# Patient Record
Sex: Female | Born: 1960 | Race: White | Hispanic: No | Marital: Married | Smoking: Never smoker
Health system: Southern US, Community
[De-identification: ages and names within clinical notes are randomized; demographics above are authoritative.]

## PROBLEM LIST (undated history)

## (undated) DIAGNOSIS — D649 Anemia, unspecified: Secondary | ICD-10-CM

## (undated) DIAGNOSIS — Z9889 Other specified postprocedural states: Secondary | ICD-10-CM

## (undated) DIAGNOSIS — K219 Gastro-esophageal reflux disease without esophagitis: Secondary | ICD-10-CM

## (undated) DIAGNOSIS — R002 Palpitations: Secondary | ICD-10-CM

## (undated) DIAGNOSIS — F419 Anxiety disorder, unspecified: Secondary | ICD-10-CM

## (undated) DIAGNOSIS — R0600 Dyspnea, unspecified: Secondary | ICD-10-CM

## (undated) DIAGNOSIS — I10 Essential (primary) hypertension: Secondary | ICD-10-CM

## (undated) DIAGNOSIS — Z8719 Personal history of other diseases of the digestive system: Secondary | ICD-10-CM

## (undated) DIAGNOSIS — I351 Nonrheumatic aortic (valve) insufficiency: Secondary | ICD-10-CM

## (undated) DIAGNOSIS — K8020 Calculus of gallbladder without cholecystitis without obstruction: Secondary | ICD-10-CM

## (undated) HISTORY — DX: Palpitations: R00.2

## (undated) HISTORY — DX: Dyspnea, unspecified: R06.00

## (undated) HISTORY — DX: Anemia, unspecified: D64.9

## (undated) HISTORY — PX: DILATION AND CURETTAGE OF UTERUS: SHX78

## (undated) HISTORY — DX: Other specified postprocedural states: Z98.890

## (undated) HISTORY — DX: Anxiety disorder, unspecified: F41.9

## (undated) HISTORY — DX: Essential (primary) hypertension: I10

## (undated) HISTORY — DX: Calculus of gallbladder without cholecystitis without obstruction: K80.20

## (undated) HISTORY — DX: Personal history of other diseases of the digestive system: Z87.19

## (undated) HISTORY — PX: HYSTEROSCOPY: SHX211

## (undated) HISTORY — DX: Nonrheumatic aortic (valve) insufficiency: I35.1

## (undated) HISTORY — DX: Gastro-esophageal reflux disease without esophagitis: K21.9

---

## 2006-05-21 ENCOUNTER — Ambulatory Visit: Payer: Self-pay | Admitting: Internal Medicine

## 2006-05-21 LAB — CONVERTED CEMR LAB
Basophils Relative: 0.5 % (ref 0.0–1.0)
CO2: 31 meq/L (ref 19–32)
Calcium: 9 mg/dL (ref 8.4–10.5)
Eosinophils Relative: 3.2 % (ref 0.0–5.0)
Free T4: 0.8 ng/dL (ref 0.6–1.6)
GFR calc Af Amer: 173 mL/min
Glucose, Bld: 89 mg/dL (ref 70–99)
HCT: 29.6 % — ABNORMAL LOW (ref 36.0–46.0)
Hemoglobin: 9.6 g/dL — ABNORMAL LOW (ref 12.0–15.0)
Lymphocytes Relative: 27.3 % (ref 12.0–46.0)
Neutro Abs: 4 10*3/uL (ref 1.4–7.7)
Platelets: 298 10*3/uL (ref 150–400)
Saturation Ratios: 4.6 % — ABNORMAL LOW (ref 20.0–50.0)
WBC: 6.6 10*3/uL (ref 4.5–10.5)

## 2006-06-14 ENCOUNTER — Ambulatory Visit: Payer: Self-pay

## 2006-06-14 ENCOUNTER — Encounter: Payer: Self-pay | Admitting: Cardiology

## 2007-02-22 ENCOUNTER — Ambulatory Visit: Payer: Self-pay | Admitting: Internal Medicine

## 2007-08-02 ENCOUNTER — Ambulatory Visit: Payer: Self-pay | Admitting: Internal Medicine

## 2007-08-02 ENCOUNTER — Observation Stay (HOSPITAL_COMMUNITY): Admission: AD | Admit: 2007-08-02 | Discharge: 2007-08-03 | Payer: Self-pay | Admitting: Internal Medicine

## 2008-02-03 ENCOUNTER — Ambulatory Visit: Payer: Self-pay | Admitting: Internal Medicine

## 2009-01-17 ENCOUNTER — Telehealth: Payer: Self-pay | Admitting: Internal Medicine

## 2009-10-08 DIAGNOSIS — I10 Essential (primary) hypertension: Secondary | ICD-10-CM | POA: Insufficient documentation

## 2009-10-08 DIAGNOSIS — R0602 Shortness of breath: Secondary | ICD-10-CM | POA: Insufficient documentation

## 2009-10-11 ENCOUNTER — Encounter (INDEPENDENT_AMBULATORY_CARE_PROVIDER_SITE_OTHER): Payer: Self-pay | Admitting: *Deleted

## 2010-02-17 ENCOUNTER — Ambulatory Visit: Payer: Self-pay | Admitting: Internal Medicine

## 2010-04-08 NOTE — Letter (Signed)
Summary: Appointment - Missed  [facility], Main Office  [address]   [place], [place] [address]   Phone: [phone]  Fax: [phone]     October 11, 2009 MRN: [id number]   [facility] [address] [place], [place]  [address]   Dear Ms. Surowiec,  Our records indicate you missed your appointment on 10-09-2009  with  Dr. Gala Romney  It is very important that we reach you to reschedule this appointment. We look forward to participating in your health care needs. Please contact us at the number listed above at your earliest convenience to reschedule this appointment.     Sincerely,     Lorne Skeens  [facility] Scheduling Team

## 2010-07-03 ENCOUNTER — Encounter: Payer: Self-pay | Admitting: Internal Medicine

## 2010-07-03 ENCOUNTER — Ambulatory Visit (INDEPENDENT_AMBULATORY_CARE_PROVIDER_SITE_OTHER): Payer: BC Managed Care – PPO | Admitting: Internal Medicine

## 2010-07-03 ENCOUNTER — Telehealth: Payer: Self-pay | Admitting: Internal Medicine

## 2010-07-03 VITALS — BP 128/88 | HR 90 | Resp 18 | Ht 62.0 in | Wt 166.4 lb

## 2010-07-03 DIAGNOSIS — R002 Palpitations: Secondary | ICD-10-CM | POA: Insufficient documentation

## 2010-07-03 MED ORDER — CLONAZEPAM 0.5 MG PO TABS: 0.5000 mg | ORAL_TABLET | Freq: Two times a day (BID) | ORAL | Status: AC | PRN

## 2010-07-03 NOTE — Telephone Encounter (Signed)
RN s/w Pt: re: symptoms: SOB, Nausea, Lightheadedness, HR Racing ~109, Elevated B/P 145/97, Burning in Lt Shoulder (not pain) burn down into the muscle, does not feel well. "Feels like heart surging"  Normal typical day: took son to school and returned home; symptoms began.  Pt reports being @ [place] on yesterday for a Rt Leg Ultrasound ~30 mins into scan Pt b/p increased to 163/103. RN advised Pt will review with Heather/Dr Bensimhon.  RN s/w Carollee Herter @ [facility] [phone]:  Pt was there on Wed, 07/02/10 for a Lower Venous (cramps, restless legs). Scan performed while standing. She c/o nausea and dizziness. 98, 149/97; 84, 140/86. Tech returned to room with a Sprite & Pt was standing behind door on cell phone; was asked to have a seat to drink Sprite & Pt reported feeling bad again; 92, 163/103. Tech did not note if b/p was rechecked after last reading.  RN will forward to Heather/Dr Bensimhon for review.

## 2010-07-03 NOTE — Progress Notes (Signed)
HPI:  Lindsey Conley is a very pleasant 50 year old woman who we have seen in the past for palpitations and dyspnea.  She has had a normal Holter monitor and normal echocardiogram.  She was previously referred for stress test, but cancelled due to financial obligations.  She also has a history of anemia secondary to heavy menstrual periods. We have not seen her since 2009.  She presents today for an unscheduled visit due to recurrent palpitations.  Was at [facility] yesterday for u/s mapping of her lower extremities due to spider veins. After about 45 mins of scanning and leg compression. Felt nauseated/hot and then became lightheaded and developed tachypalpitations and dyspnea. No CP. Took BP 140/97. Recovered after about 30 mins.  This am felt OK originally but then developed nausea and tachypalpitations. Then developed some pressure in her neck. Currently peri-menopausal and under a lot of emotional stress. Took ASA and Xanax. Felt much better.   ROS: All systems negative except as listed in HPI, PMH and Problem List.  Past Medical History  Diagnosis Date  . Anxiety   . Migraine   . Tired     Current Outpatient Prescriptions  Medication Sig Dispense Refill  . ALPRAZolam (XANAX) 0.25 MG tablet Take 0.25 mg by mouth at bedtime as needed.        [name] CALCIUM MAGNESIUM 750 PO Take 1 capsule by mouth 2 (two) times daily.        [name] ibuprofen (ADVIL,MOTRIN) 100 MG chewable tablet Chew 100 mg by mouth every 8 (eight) hours as needed.        . Multiple Vitamin (MULTIVITAMIN) tablet Take 1 tablet by mouth daily.           PHYSICAL EXAM: Filed Vitals:   07/03/10 1656  BP: 128/88  Pulse: 90  Resp: 18   General:  Well appearing. No resp difficulty HEENT: normal Neck: supple. JVP flat. Carotids 2+ bilaterally; no bruits. No lymphadenopathy or thryomegaly appreciated. Cor: PMI normal. Regular rate & rhythm. No rubs, gallops or murmurs. Lungs: clear Abdomen: soft, nontender, nondistended.  No hepatosplenomegaly. No bruits or masses. Good bowel sounds. Extremities: no cyanosis, clubbing, rash, edema Neuro: alert & orientedx3, cranial nerves grossly intact. Moves all 4 extremities w/o difficulty. Good distal pulses. Affect pleasant.    ECG: NSR 90 No ST-T wave abnormalities. Low volts   ASSESSMENT & PLAN:

## 2010-07-03 NOTE — Assessment & Plan Note (Signed)
I suspect the majority of her symptoms are related to panic attacks. However, given associated dyspnea and neck pain will proceed with ETT to exclude underlying CAD. Start klonopin 0.5 bid to see if this helps. If this works may eventually need to consider an SSRI or Wellbutrin to limit benzodiazepene use.

## 2010-07-03 NOTE — Telephone Encounter (Signed)
Per Dr Gala Romney ok to add on to end of day, pt is aware appt sch for 5pm today

## 2010-07-03 NOTE — Telephone Encounter (Signed)
Pt states she not feeling right. Pt states her blood pressure 145/97. Pt states she having a burning feeling in her left arm and sob. Pt wants to know if she can come in today.

## 2010-07-03 NOTE — Patient Instructions (Signed)
Start Klonopin 0.5mg  Twice daily  Your physician has requested that you have an exercise tolerance test. For further information please visit [email or web address] Please also follow instruction sheet, as given. Your physician recommends that you schedule a follow-up appointment in: 4 months

## 2010-07-22 NOTE — Assessment & Plan Note (Signed)
[facility]                            CARDIOLOGY OFFICE NOTE   NAME:Lindsey Conley, Lindsey Conley                       MRN:          [id number]  DATE:02/03/2008                            DOB:          1960-08-30    PRIMARY CARE PHYSICIAN:  Lindsey Kuster, MD at [place], [place].   INTERVAL HISTORY:  Ms. Lindsey Conley is a very pleasant 50 year old woman who  has been following for palpitations and dyspnea.  She has had a normal  Holter monitor and normal echocardiogram.  She was previously referred  for stress test, but cancelled due to financial obligations.  She also  has a history of anemia secondary to heavy menstrual periods.   She returns today for routine followup.  Overall, she is doing fairly  well.  She does say that she gets mildly dyspneic with moderate activity  that seems to be fluctuating with her menstrual cycle.  She has not had  any orthopnea, no PND, no lower extremity edema, no chest pain.   CURRENT MEDICATIONS:  Calcium and coenzyme Q-10.   PHYSICAL EXAMINATION:  GENERAL:  She is well-appearing in no acute  distress, ambulates around the clinic without respiratory difficulty.  VITAL SIGNS:  Blood pressure is 132/82, heart rate is 82, weight is 182.  HEENT:  Normal.  NECK:  Supple.  No obvious JVD.  Carotids are 2+ bilaterally without any  bruits.  There is no lymphadenopathy or thyromegaly.  CARDIAC:  PMI is  nondisplaced.  Regular rate and rhythm with soft systolic ejection  murmur at the right sternal border.  S2 is well preserved.  LUNGS:  Clear.  ABDOMEN:  Obese, nontender, nondistended, no hepatosplenomegaly, no  bruits, no masses.  Good bowel sounds.  EXTREMITIES:  Warm with no  cyanosis, clubbing, or edema.  NEURO:  Alert and oriented x3.  Cranial nerves II through XII are  intact.  Affect is very pleasant.   EKG shows normal sinus rhythm at a rate of 82.  No ST-T wave  abnormalities.   ASSESSMENT AND PLAN:  1. Intermittent dyspnea.   I suspect this may be related to her anemia      and heavy periods.  There also may be a component of      deconditioning.  We did discuss the possibility of at some point      proceeding with a stress test to rule out underlying coronary      artery disease.  2. Hypertension.  Blood pressure is mildly elevated.  I encouraged her      to try and lower this by watching her diet and exercising and      trying to lose a bit of weight.  We will see her back in 6 months      to see how she is doing.     Lindsey Buckles. Bensimhon, MD  Electronically Signed    DRB/MedQ  DD: 02/03/2008  DT: 02/03/2008  Job #: [id number]

## 2010-07-22 NOTE — Assessment & Plan Note (Signed)
[facility]                            CARDIOLOGY OFFICE NOTE   NAME:Conley, Lindsey                       MRN:          [id number]  DATE:02/22/2007                            DOB:          05/20/1960    PRIMARY CARE PHYSICIAN:  Dr. Jonette Eva in [place], [place].   INTERVAL HISTORY:  Ms. Lindsey Conley is a very pleasant 50 year old woman  with no know history of coronary artery disease.  We have previously  seen her for palpitations.  She had a normal Holter monitor and normal  echocardiogram.  She also has a history of migraine headaches as well as  anemia presumed secondary to heavy menstrual periods.  She returns today  with her husband for routine followup.  Overall she is doing fairly  well, however she notes that she continues to have problems with heavy  periods and during that week and sometime after she feels quite weak and  with no energy.  She occasionally has some shortness of breath and chest  pains.  These are more prominent over the past few weeks.  She has not  had any prolonged episodes.  She denies any palpitations, no syncope or  presyncope.   CURRENT MEDICATIONS:  Calcium and coenzyme Q10.   PHYSICAL EXAMINATION:  She is in no acute distress, ambulates around the  clinic without any respiratory difficulty.  Blood pressure is 118/80,  heart rate is 76, weight is 179.  HEENT:  Normal.  NECK:  Supple, there is no JVD, carotids are 2+ bilaterally without  bruits, there is no lymphadenopathy or thyromegaly.  CARDIAC:  Regular rate and rhythm; no murmurs, rubs, or gallops.  LUNGS:  Clear.  ABDOMEN:  Soft, nontender, nondistended, obese, no hepatosplenomegaly,  no bruits, no masses, good bowel sounds.  EXTREMITIES:  Warm with no cyanosis, clubbing or edema, no rash.  NEURO:  Alert and oriented x3, cranial nerves II through XII are intact,  moves all 4 extremities without difficulty.  Affect is very pleasant.   ASSESSMENT/PLAN:  Dyspnea and  weakness.  I suspect this is primarily  related to her heavy menstrual periods and anemia.  I have asked her to  follow up with her gynecologist for further evaluation and perhaps  hormonal treatment, however, she is also having occasional/intermittent  chest pain.  I figure it is reasonable to get a screening treadmill  Myoview to rule out any underlying ischemia.   DISPOSITION:  Will see her back in 6 months for routine followup.  Will  contact her with the results of her stress test.     Bevelyn Buckles. Bensimhon, MD  Electronically Signed   DRB/MedQ  DD: 02/22/2007  DT: 02/23/2007  Job #: [id number]

## 2010-07-22 NOTE — Discharge Summary (Signed)
NAME:  Lindsey Conley, Lindsey Conley              ACCOUNT NO.:  [id number]   MEDICAL RECORD NO.:  [id number]          PATIENT TYPE:  INP   LOCATION:  6531                         FACILITY:  [facility]   PHYSICIAN:  Bevelyn Buckles. Bensimhon, MDDATE OF BIRTH:  Jul 01, 1960   DATE OF ADMISSION:  08/02/2007  DATE OF DISCHARGE:  08/03/2007                               DISCHARGE SUMMARY   PRIMARY CARDIOLOGIST:  Bevelyn Buckles. Bensimhon, MD   PRIMARY CARE PHYSICIAN:  Dr. Jonette Eva, in [place], [place].   PROCEDURE PERFORMED DURING HOSPITALIZATION:  1. Abdominal ultrasound.  A:  Large gallstone noted in the neck of the gallbladder measuring 2.4  cm in diameter with non-distension of the gallbladder or wall  thickening.  No intra or extrahepatic biliary dilation.  Liver and  spleen are normal without lesion.   FINAL DISCHARGE DIAGNOSES:  1. Noncardiac chest pain.  2. Cholelithiasis.  A:  Gall stone measuring 2.4 cm in diameter in the neck of the  gallbladder for ultrasound.  1. Anxiety.  2. History of anemia secondary to dysmenorrhagia.  3. Esophageal spasm.  4. History of migraine headaches.   HOSPITAL COURSE:  This is a 50 year old Caucasian female transferred  from [facility] in [place] secondary to recurrent chest pain  with warm hot flash, feeling dizziness before getting out of bed.  The  morning of admission, she went to the bathroom and then went back to bed  as the symptoms worsened.  Heart was pounding.  She felt a squeezing  inside and could not breath.  The patient presented to [facility] ER after  calling 911, and she requested to be seen by Dr. Arvilla Meres and  was transferred to [facility].  On arrival to [facility], the patient  was seen and examined by myself and Dr. Gala Romney.  The patient is  feeling better now with exception of the headache.  She was mildly  hypertensive at 160/100, which is now normalized.  The patient is  without complaint with the exception of headache on  admission.   The patient did state that in the past she had had some abdominal pain  and she had been told about 4 years ago that she had some gallstones.  Although, she never followed up with it as that was not a recurrence of  symptoms at that time.  We reviewed her past records, and she had a  stress test in 2000, which was found to be normal.  She did follow with  Dr. Gala Romney in 2008, and a stress test was ordered at that time.  The  patient did not arrive for appointment.  The patient was admitted to  rule out myocardial infarction with enzymes to be cycled.   Cardiac enzymes were found to be negative at 0.03, 0.03, and 0.03  respectively.  The patient's cholesterol was found to be normal.  Her  liver enzymes were normal.  Gallbladder ultrasound was completed to  evaluate for gallstones that she states she had some in the past, and it  was found that she did have a large gallstone measuring 2.4 cm in  diameter at  the neck of the gallbladder although this was  nonobstructive.  [facility] was called for evaluation prior to  discharge for need for surgery or outpatient evaluation.  They assessed  the patient, reviewed the gallbladder ultrasound and felt that she could  be followed up with [place] an outpatient.  The patient was discharged home  without any further complaints.  No new medications were ordered.  She  is to follow up with [facility] as an outpatient.   DISCHARGE LABORATORY DATA:  Sodium 141, potassium 3.6, chloride 109, CO2  of 25, BUN 5, creatinine 0.56, and glucose 106.  Hemoglobin 9.2,  hematocrit 28.4, white blood cells 7.8, and platelets 252.  Total  cholesterol 157, triglycerides 80, HDL 45, and LDL 96.  Troponins were  found to be negative x3.  EKG revealing normal sinus rhythm with  ventricular rate of 67 beats per minute.   DISCHARGE MEDICATIONS:  None.   DISCHARGE FOLLOWUP:  1. The patient will follow up with [facility] at [phone] for followup appointment as an outpatient concerning      gallbladder symptoms and biliary colic.  2. The patient is scheduled to see Dr. Arvilla Meres on September 08, 2007, at 2:30 p.m. at which time, a followup stress Celine Ahr will be      discussed at his discretion.  3. The patient is to follow with her primary care physician for      continued evaluation secondary to anemia.  4. The patient is to follow with OB/GYN secondary to dysmenorrhea.   TIME SPENT WITH THE PATIENT TO INCLUDE PHYSICIAN TIME:  35 minutes.      Bettey Mare. Lyman Bishop, NP      Bevelyn Buckles. Bensimhon, MD  Electronically Signed    KML/MEDQ  D:  08/03/2007  T:  08/04/2007  Job:  [id number]   cc:   [facility]  Dr. Jonette Eva

## 2010-07-22 NOTE — H&P (Signed)
NAME:  Lindsey Conley, Lindsey Conley              ACCOUNT NO.:  [id number]   MEDICAL RECORD NO.:  [id number]          PATIENT TYPE:  INP   LOCATION:  6531                         FACILITY:  [facility]   PHYSICIAN:  Bettey Mare. [place], NPDATE OF BIRTH:  12-30-1960   DATE OF ADMISSION:  08/02/2007  DATE OF DISCHARGE:                              HISTORY & PHYSICAL   HISTORY OF PRESENT ILLNESS:  This is a 50 year old Caucasian female with  no prior history of coronary artery disease, who was transferred from  [facility] ER secondary to complaints of chest pain with warm, hot flash,  dizziness before getting out of bed this morning.  She went to the  bathroom and felt worse, very dizzy, lightheaded, went back to bed.  Her  heart was pounding.  She felt queasy inside and could not take deep  breath.  She tried to brush her teeth and was unable to do that  secondary to the symptoms.  She continued to have more dyspnea and went  back to bed.  Her son called 911.  She thought she was dying in front  my family.  She also began to have a burning, heavy feeling in her  chest.  EMS placed her on oxygen and she was found to be mildly  hypertensive in the field at 160/100.  The patient continued to have  flushing feelings on and off while being transported to [facility] ER.  There, the patient was placed on oxygen.  EKG completed revealing sinus  tachycardia only with no evidence of ischemia.  Cardiac enzymes were  found to be negative.  A call was placed to Dr. Arvilla Meres who is  her primary cardiologist and he accepted the patient on transfer.   Since being admitted to [facility], the patient is feeling much  better.  She has complained of a mild headache, otherwise, she has not  had any further complaints of flushing feeling or shortness of breath.   REVIEW OF SYSTEMS:  Positive for dizziness, weakness, heart pounding,  flushing feeling, warmth, hot flash-type symptoms, mild abdominal pain,  low-grade  nausea.   PAST MEDICAL HISTORY:  1. Anemia secondary to dysmenorrhea.  2. Migraine headaches.  3. Anxiety.  4. Esophageal spasms.  5. History of gallstones.   PAST CARDIAC WORKUP:  She had a stress test in 2000, which was normal  per the patient report.  A followup stress was ordered last year, which  the patient did not show up for.  Echocardiogram completed in 2008  revealed an EF of 55-60% with mild aortic valve regurgitation and mild  MVR.   SOCIAL HISTORY:  She lives in [place] with her husband.  She is a  [employer] trader.  She has four children, one of which is autistic.  She does  not smoke, does not drink alcohol, and does not use drugs.   FAMILY HISTORY:  Mother with asthma and irregular heart rate, father  with hypertension, sister with hypertension.   CURRENT MEDICATIONS:  At home, vitamins and Advil p.r.n.   ALLERGIES:  PHENERGAN, PENICILLIN, and PERCOCET.   LABORATORY DATA:  Current  labs are pending.  EKG reveals sinus  tachycardia with ventricular rate of 90 beats per minute.   PHYSICAL EXAMINATION:  VITAL SIGNS:  Blood pressure 98/67, pulse 87,  respirations 23, O2 sat 98%, and temperature 98.5.  HEENT:  Head is normocephalic, atraumatic.  Eyes, PERRLA.  Mucous  membranes pink and moist.  Tongue is midline.  NECK:  Supple with no JVD.  No carotid bruits appreciated.  CARDIOVASCULAR:  Regular rate and rhythm without murmurs, rubs, or  gallops.  LUNGS:  Clear to auscultation without wheezes, rales, or rhonchi.  ABDOMEN:  Soft, mild right upper quadrant tenderness with deep  palpation, although there is a negative Murphy sign.  Bowel sounds 2+.  EXTREMITIES:  Without clubbing, cyanosis, or edema.  Dorsalis pedal  pulses and posterior tibial pulses are 1+ bilaterally.  Radial pulses 1+  bilaterally.  NEURO:  Cranial nerves II through XII are grossly intact.   IMPRESSION:  1. Noncardiac chest pain.  2. Hot flashes.  3. Anxiety.   PLAN:  The patient was seen and  examined by Dr. Arvilla Meres and  myself, flushing, acute dyspnea, and palpitations of unknown etiology,  given normal EKG and cardiac enzymes, doubt this is ischemia, but will  observe for now and cardiac enzymes to rule out an MI.  There is no  evidence of dysrhythmia by EMS.  She was still symptomatic at that time.  Question hot flashes versus panic attack versus reflux, remote chance of  pyelonephritis, although this is very unlikely.  We will observe on  telemetry to rule out MI.  If negative, we will discharge home in the  a.m. for an outpatient Myoview to be rescheduled.  Placed her on a low-  dose beta-blocker with parameters secondary to hypertension.      Bettey Mare. Lyman Bishop, NP     KML/MEDQ  D:  08/02/2007  T:  08/03/2007  Job:  [id number]

## 2010-07-25 NOTE — Assessment & Plan Note (Signed)
[facility]                            CARDIOLOGY OFFICE NOTE   NAME:Conley, Lindsey                       MRN:          [id number]  DATE:05/21/2006                            DOB:          04-Jun-1960    PRIMARY CARE PHYSICIAN:  Dr. Jonette Eva in [place], [place].   REASON FOR CONSULTATION:  Dyspnea and palpitations.   HISTORY OF PRESENT ILLNESS:  Lindsey Conley is a very pleasant 50 year old  woman with no known coronary artery disease. We follow her husband,  Lindsey Conley, in clinic for his HOCM. She tells me that she has a history of  palpitations and saw Dr. Earna Coder in [place] in 2000. She had a 40 hour  monitor and stress test, which were both normal. She notes that over the  past few months, she often feels a little fluttering in her heart,  followed by a very hard beat, which is fairly disconcerting to her. She  also says that she feels very weak and short of breath at times. She  correlates the weakness and shortness of breath with the time  immediately after her menstrual cycle. Later in the month, she tends to  feel better. She says that her palpitations occur, probably 2 or 3 times  a month. She does her best to avoid caffeine. She is under a great deal  of stress. She denies any chest pain. No orthopnea, no PND. She does  have a history of migraine headaches.   REVIEW OF SYSTEMS:  She does have a history of anemia and previously  took iron but stopped due to constipation. She denies snoring. No  witnessed sleep apnea. No fevers, chills, nausea, vomiting, or diarrhea.  The remainder of review of systems is negative except for HPI and  problem list.   PROBLEM LIST:  1. History of palpitations.      a.     Previous workup in 2000 with 48 hour Holter monitor that       were normal per patient report.  2. Migraine headaches.  3. Heavy menstrual periods with associated anemia.  4. History of gallstones.   CURRENT MEDICATIONS:  Calcium and  magnesium as well as coenzyme q.10.   ALLERGIES:  PHENERGAN, PENICILLIN, AMOXICILLIN.   SOCIAL HISTORY:  She lives in [place]. She is married with 5 children.  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 She does not smoke or drink alcohol.   FAMILY HISTORY:  There is no history of premature coronary artery  disease.   PHYSICAL EXAMINATION:  GENERAL:  Well appearing. No acute distress.  Ambulates around the clinic without any respiratory difficulty.  VITAL SIGNS:  Blood pressure 100/79 with a heart rate of 84. Weight is  179.  HEENT:  Sclerae anicteric. EO MI. There are no xanthelasma's. Moist  mucous membranes. Oropharynx clear.  NECK:  Supple. There is no JVD. Carotids are 2+ bilaterally without any  bruits. There is no lymphadenopathy or thyromegaly.  CARDIAC:  Regular rate and rhythm with no murmur, rub, or gallop.  LUNGS:  Clear.  ABDOMEN:  Soft,  nontender, and nondistended. No hepatosplenomegaly. No  bruits, no masses appreciated.  EXTREMITIES:  Warm with no clubbing, cyanosis, or edema. There is good  capillary refill.  NEUROLOGIC:  Alert and oriented times three. Cranial nerves 2-12 are  intact. She moves all 4 extremities without difficulty. Affect is  pleasant.   LABORATORY/DIAGNOSTIC STUDIES:  EKG shows sinus rhythm at a rate of 84  with low voltage. No significant STT wave changes. QT interval is  normal. There is no evidence of preexcitation. PR interval is 124  milliseconds.   ASSESSMENT/PLAN:  1. Palpitations. I suspect that she is having symptomatic PAC's or      PVC's. I told her that these were relatively benign. We did discuss      the option of a beta blocker but given her fatigue and borderline      low blood pressure, we decided to just watch and wait. We will get      an echocardiogram to make sure she has a structurally normal heart.      If they persist, will also put a monitor on her.  2. Fatigue. I suspect this may  be related to anemia with her heavy      menstrual cycles. Will check a CBC and thyroid panel. I have asked      her to followup with her gynecologist.   DISPOSITION:  Will see her back in 2 months for routine followup.     Bevelyn Buckles. Bensimhon, MD  Electronically Signed    DRB/MedQ  DD: 05/21/2006  DT: 05/22/2006  Job #: [id number]   cc:   Dr. Sallyanne Kuster Heist

## 2010-08-27 ENCOUNTER — Encounter: Payer: Self-pay | Admitting: Internal Medicine

## 2010-10-07 ENCOUNTER — Encounter: Payer: Self-pay | Admitting: Internal Medicine

## 2010-10-15 ENCOUNTER — Encounter: Payer: BC Managed Care – PPO | Admitting: Internal Medicine

## 2010-11-05 ENCOUNTER — Encounter: Payer: BC Managed Care – PPO | Admitting: Physician Assistant

## 2010-11-26 ENCOUNTER — Encounter: Payer: BC Managed Care – PPO | Admitting: Physician Assistant

## 2010-12-03 LAB — COMPREHENSIVE METABOLIC PANEL
AST: 24
Albumin: 3.3 — ABNORMAL LOW
Calcium: 8.9
Creatinine, Ser: 0.56
GFR calc Af Amer: >60
Total Protein: 6.2

## 2010-12-03 LAB — CARDIAC PANEL(CRET KIN+CKTOT+MB+TROPI)
CK, MB: 1.2
Relative Index: INVALID
Relative Index: INVALID
Total CK: 52
Troponin I: 0.03

## 2010-12-03 LAB — CBC
MCHC: 32.9
MCV: 66.5 — ABNORMAL LOW
Platelets: 252
RDW: 18.5 — ABNORMAL HIGH
WBC: 7.8

## 2010-12-03 LAB — LIPID PANEL
HDL: 45
Triglycerides: 80
VLDL: 16

## 2010-12-03 LAB — TSH: TSH: 0.648

## 2013-08-25 ENCOUNTER — Encounter (INDEPENDENT_AMBULATORY_CARE_PROVIDER_SITE_OTHER): Payer: Self-pay

## 2013-08-25 ENCOUNTER — Ambulatory Visit (INDEPENDENT_AMBULATORY_CARE_PROVIDER_SITE_OTHER): Payer: BC Managed Care – PPO | Admitting: Cardiology

## 2013-08-25 ENCOUNTER — Encounter: Payer: Self-pay | Admitting: Cardiology

## 2013-08-25 VITALS — BP 125/82 | HR 78 | Ht 62.0 in | Wt 176.8 lb

## 2013-08-25 DIAGNOSIS — R0989 Other specified symptoms and signs involving the circulatory and respiratory systems: Secondary | ICD-10-CM

## 2013-08-25 DIAGNOSIS — R0602 Shortness of breath: Secondary | ICD-10-CM

## 2013-08-25 DIAGNOSIS — R5383 Other fatigue: Secondary | ICD-10-CM | POA: Insufficient documentation

## 2013-08-25 DIAGNOSIS — R0609 Other forms of dyspnea: Secondary | ICD-10-CM

## 2013-08-25 LAB — CBC WITH DIFFERENTIAL/PLATELET
Basophils Absolute: 0 10*3/uL (ref 0.0–0.1)
Basophils Relative: 0.4 % (ref 0.0–3.0)
Eosinophils Absolute: 0.1 10*3/uL (ref 0.0–0.7)
Eosinophils Relative: 1.7 % (ref 0.0–5.0)
HCT: 38.4 % (ref 36.0–46.0)
Hemoglobin: 12.9 g/dL (ref 12.0–15.0)
Lymphocytes Relative: 26.5 % (ref 12.0–46.0)
Lymphs Abs: 2 10*3/uL (ref 0.7–4.0)
MCHC: 33.7 g/dL (ref 30.0–36.0)
MCV: 84.4 fl (ref 78.0–100.0)
Monocytes Absolute: 0.7 10*3/uL (ref 0.1–1.0)
Monocytes Relative: 9.4 % (ref 3.0–12.0)
Neutro Abs: 4.6 10*3/uL (ref 1.4–7.7)
Neutrophils Relative %: 62 % (ref 43.0–77.0)
Platelets: 221 10*3/uL (ref 150.0–400.0)
RBC: 4.55 Mil/uL (ref 3.87–5.11)
RDW: 15.3 % (ref 11.5–15.5)
WBC: 7.4 10*3/uL (ref 4.0–10.5)

## 2013-08-25 LAB — T3, FREE: T3, Free: 2.5 pg/mL (ref 2.3–4.2)

## 2013-08-25 LAB — COMPREHENSIVE METABOLIC PANEL
ALT: 29 U/L (ref 0–35)
AST: 21 U/L (ref 0–37)
Albumin: 3.7 g/dL (ref 3.5–5.2)
Alkaline Phosphatase: 48 U/L (ref 39–117)
BUN: 6 mg/dL (ref 6–23)
CO2: 29 mEq/L (ref 19–32)
Calcium: 9.1 mg/dL (ref 8.4–10.5)
Chloride: 103 mEq/L (ref 96–112)
Creatinine, Ser: 0.5 mg/dL (ref 0.4–1.2)
GFR: 145.04 mL/min (ref >60.00)
Glucose, Bld: 91 mg/dL (ref 70–99)
Potassium: 4.2 mEq/L (ref 3.5–5.1)
Sodium: 138 mEq/L (ref 135–145)
Total Bilirubin: 1 mg/dL (ref 0.2–1.2)
Total Protein: 6.5 g/dL (ref 6.0–8.3)

## 2013-08-25 LAB — TSH: TSH: 0.74 u[IU]/mL (ref 0.35–4.50)

## 2013-08-25 LAB — T4, FREE: Free T4: 0.81 ng/dL (ref 0.60–1.60)

## 2013-08-25 NOTE — Patient Instructions (Addendum)
Your physician recommends that you continue on your current medications as directed. Please refer to the Current Medication list given to you today.  Your physician has requested that you have a stress echocardiogram. For further information please visit [email or web address]. Please follow instruction sheet as given.  Your physician recommends that you return for lab work in: [place] (CMP,TSH,CBC T3 4 FREE)  Your physician recommends that you return for lab work in: [place] E[facility]   Your physician wants you to follow-up in: [place] will receive a reminder letter in the mail two months in advance. If you don't receive a letter, please call our office to schedule the follow-up appointment.  WE WILL CALL YOU WITH YOUR RESULTS

## 2013-08-25 NOTE — Progress Notes (Signed)
Patient ID: Lindsey Conley, female   DOB: [birth year], 53 y.o.   MRN: [id number]     Patient Name: Lindsey Conley Date of Encounter: 08/25/2013  Primary Care Provider:  No primary provider on file. Primary Cardiologist:  Dorothy Spark  Problem List   Past Medical History  Diagnosis Date  . Anxiety   . Migraine   . Tired   . Palpitations     echo and Holter in 2009 were normal  . Gallstones   . Anemia   . Dyspnea   . HTN (hypertension)   . History of colonoscopy    Past Surgical History  Procedure Laterality Date  . Hysteroscopy    . Dilation and curettage of uterus      Allergies  Allergies  Allergen Reactions  . Penicillins   . Phenergan [Promethazine Hcl]     Rash  . Promethazine Hcl    HPI  A very pleasant 53 year old female who just reach her menopause with no significant past medical history who is coming with concerns of exertional shortness of breath and fatigue. The patient was seen by Dr. Britta Mccreedy on in the past as a part of family screening. Patient's husband has history of hypertrophic cardiomyopathy and underwent a myectomy in the past. As a result 5 of her kids were tested but none was proven to have hypertrophic cardiomyopathy dilatated. 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 She is very active however recently she has been feeling exhausted and gets short of breath with for example walking 2 flight of stairs. She denies any chest pain, back pain jaw pain or arm pain. She denies any palpitations. Denies any lower extremity edema.  Home Medications  Prior to Admission medications   Medication Sig Start Date End Date Taking? Authorizing Provider  ALPRAZolam (XANAX) 0.25 MG tablet Take 0.25 mg by mouth at bedtime as needed.     Yes Historical Provider, MD  ibuprofen (ADVIL,MOTRIN) 100 MG chewable tablet Chew 100 mg by mouth every 8 (eight) hours as needed.     Yes Historical  Provider, MD  MAGNESIUM PO Take by mouth as needed.   Yes Historical Provider, MD  Multiple Vitamin (MULTIVITAMIN) tablet Take 1 tablet by mouth daily.     Yes Historical Provider, MD    Family History  Family History  Problem Relation Age of Onset  . Asthma Mother 56  . Arrhythmia Mother   . Hypertension Father 53  . Other Sister 65    platelet disorder    Social History  History   Social History  . Marital Status: Married    Spouse Name: N/A    Number of Children: 65  . Years of Education: N/A   Occupational History  . care giver    Social History Main Topics  . Smoking status: Never Smoker   . Smokeless tobacco: Not on file  . Alcohol Use: No  . Drug Use: No  . Sexual Activity: Not on file   Other Topics Concern  . Not on file   Social History Narrative  . No narrative on file     Review of Systems, as per HPI, otherwise negative General:  No chills, fever, night sweats or weight changes.  Cardiovascular:  No chest pain, dyspnea on exertion, edema, orthopnea, palpitations, paroxysmal nocturnal dyspnea. Dermatological: No rash, lesions/masses Respiratory: No cough, dyspnea Urologic: No hematuria, dysuria Abdominal:   No nausea, vomiting, diarrhea,  bright red blood per rectum, melena, or hematemesis Neurologic:  No visual changes, wkns, changes in mental status. All other systems reviewed and are otherwise negative except as noted above.  Physical Exam  Blood pressure 125/82, pulse 78, height $RemoveBe'5\' 2"'jOETQqUKJ$  (1.575 m), weight 176 lb 12.8 oz (80.196 kg).  General: Pleasant, NAD Psych: Normal affect. Neuro: Alert and oriented X 3. Moves all extremities spontaneously. HEENT: Normal  Neck: Supple without bruits or JVD. Lungs:  Resp regular and unlabored, CTA. Heart: RRR no s3, s4, dear a mild systolic and diastolic murmur.. Abdomen: Soft, non-tender, non-distended, BS + x 4.  Extremities: No clubbing, cyanosis or edema. DP/PT/Radials 2+ and equal  bilaterally.  Labs:  No results found for this basename: CKTOTAL, CKMB, TROPONINI,  in the last 72 hours Lab Results  Component Value Date   WBC 7.8 08/02/2007   HGB 9.3* 08/02/2007   HCT 28.4* 08/02/2007   MCV 66.5* 08/02/2007   PLT 252 08/02/2007    No results found for this basename: DDIMER   No components found with this basename: POCBNP,     Component Value Date/Time   NA 141 08/02/2007 1610   K 3.6 08/02/2007 1610   CL 109 08/02/2007 1610   CO2 25 08/02/2007 1610   GLUCOSE 106* 08/02/2007 1610   BUN 5* 08/02/2007 1610   CREATININE 0.56 08/02/2007 1610   CALCIUM 8.9 08/02/2007 1610   PROT 6.2 08/02/2007 1610   ALBUMIN 3.3* 08/02/2007 1610   AST 24 08/02/2007 1610   ALT 25 08/02/2007 1610   ALKPHOS 36* 08/02/2007 1610   BILITOT 0.8 08/02/2007 1610   GFRNONAA >60 08/02/2007 1610   GFRAA  Value: >60        The eGFR has been calculated using the MDRD equation. This calculation has not been validated in all clinical 08/02/2007 1610   Lab Results  Component Value Date   CHOL  Value: 157        ATP III CLASSIFICATION:  <200     mg/dL   Desirable  200-239  mg/dL   Borderline High  >=240    mg/dL   High 08/02/2007   HDL 45 08/02/2007   LDLCALC  Value: 96        Total Cholesterol/HDL:CHD Risk Coronary Heart Disease Risk Table                     Men   Women  1/2 Average Risk   3.4   3.3 08/02/2007   TRIG 80 08/02/2007    Accessory Clinical Findings  Echocardiogram - 2008 SUMMARY - Overall left ventricular systolic function was normal. Left ventricular ejection fraction was estimated , range being 55 % to 60 %. There were no left ventricular regional wall motion abnormalities. - There was mild aortic valvular regurgitation. - There was mild mitral valvular regurgitation.  ECG - normal sinus rhythm, normal EKG, unchanged from EKG in 2012   Assessment & Plan  A very pleasant 53 year old female  1. profound fatigue and dyspnea on exertion - risk factors include obesity and being  postmenopausal. We'll check labs including comprehensive metabolic profile CBC and TSH we will proceed with stress echocardiogram to rule out any ischemia if all the results are normal we'll encourage patient to start exercising as she may be deconditioned. She had an echocardiogram performed in 2008 with normal biventricular function and mild aortic and mitral insufficiency.  2. blood pressure - well controlled  3. Lipids - not checked in over  file we will draw on the day of her stress test.  If all results normal followup in 1 year.   Dorothy Spark, MD, [facility] 08/25/2013, 11:15 AM

## 2013-10-12 ENCOUNTER — Telehealth: Payer: Self-pay | Admitting: *Deleted

## 2013-10-12 DIAGNOSIS — R0609 Other forms of dyspnea: Principal | ICD-10-CM

## 2013-10-12 DIAGNOSIS — R0600 Dyspnea, unspecified: Secondary | ICD-10-CM

## 2013-10-12 NOTE — Telephone Encounter (Signed)
Contacted pt to inform her that per Dr Meda Coffee her insurance will not cover for her to have a stress echo done.  Informed pt that we will need to set her up to have a GXT and a 2D echo before a stress echo can be performed.  Placed orders into epic.  Pt education provided about GXT instructions.  Pt education provided on what a 2 D echo and GXT is.  Informed pt that someone from our scheduling dept will be contacting her soon to have these 2 appts set up.  Pt states "I prefer having them both on the same day, and on Oct 20, 2013 when the stress echo was originally scheduled." Informed pt that I will certainly inform the schedulers of her preference.  Pt verbalized understanding and agrees with this plan.  Pt very gracious for all the assistance provided.

## 2013-10-12 NOTE — Telephone Encounter (Signed)
Message copied by Nuala Alpha on Thu Oct 12, 2013 10:13 AM ------      Message from: Benancio Deeds      Created: Thu Oct 12, 2013  8:15 AM      Regarding: FW: stress echo       Good morning ladies,            Jeanene Erb can you cancel stress echo and schedule a GXT and 2D echo per Dr. Meda Coffee due to insurance please?            [name] do you mind putting order in for 2D echo and GXT and giving the patient instructions for GXT please?            Thank you ladies so much!  Let me know if I can do anything.            Caryl Pina            ----- Message -----         From: Dorothy Spark, MD         Sent: 10/11/2013   6:22 PM           To: Benancio Deeds      Subject: RE: stress echo                                          That's ok      Thank you,      K      ----- Message -----         From: Benancio Deeds         Sent: 10/10/2013   9:40 AM           To: Dorothy Spark, MD      Subject: stress echo                                              Insurance is denying due to ability to walk on treadmill.            Are you okay with doing a 2D echo and GXT?                  Thanks,            Caryl Pina                   ------

## 2013-10-18 ENCOUNTER — Telehealth (HOSPITAL_COMMUNITY): Payer: Self-pay

## 2013-10-18 NOTE — Telephone Encounter (Signed)
Encounter complete. 

## 2013-10-20 ENCOUNTER — Other Ambulatory Visit (HOSPITAL_COMMUNITY): Payer: BC Managed Care – PPO

## 2013-10-20 ENCOUNTER — Ambulatory Visit (HOSPITAL_BASED_OUTPATIENT_CLINIC_OR_DEPARTMENT_OTHER)
Admission: RE | Admit: 2013-10-20 | Discharge: 2013-10-20 | Disposition: A | Discharge status: Home / Self Care | Payer: BC Managed Care – PPO | Source: Ambulatory Visit | Attending: Cardiology | Admitting: Cardiology

## 2013-10-20 ENCOUNTER — Other Ambulatory Visit: Payer: BC Managed Care – PPO

## 2013-10-20 ENCOUNTER — Ambulatory Visit (HOSPITAL_COMMUNITY)
Admission: RE | Admit: 2013-10-20 | Discharge: 2013-10-20 | Disposition: A | Discharge status: Home / Self Care | Payer: BC Managed Care – PPO | Source: Ambulatory Visit | Attending: Cardiology | Admitting: Cardiology

## 2013-10-20 DIAGNOSIS — R0609 Other forms of dyspnea: Secondary | ICD-10-CM | POA: Diagnosis present

## 2013-10-20 DIAGNOSIS — R0600 Dyspnea, unspecified: Secondary | ICD-10-CM

## 2013-10-20 DIAGNOSIS — I359 Nonrheumatic aortic valve disorder, unspecified: Secondary | ICD-10-CM

## 2013-10-20 DIAGNOSIS — R0989 Other specified symptoms and signs involving the circulatory and respiratory systems: Secondary | ICD-10-CM | POA: Diagnosis present

## 2013-10-20 NOTE — Progress Notes (Signed)
2D Echocardiogram Complete.  10/20/2013   Graciemae Delisle Charco, RDCS

## 2013-10-20 NOTE — Procedures (Signed)
Exercise Treadmill Test  Pre-Exercise Testing Evaluation  Test  Exercise Tolerance Test Ordering MD: Ena Dawley, MD    Unique Test No: 1   Treadmill:  1  Indication for ETT: chest pain - rule out ischemia  Contraindication to ETT: No   Stress Modality: exercise - treadmill  Cardiac Imaging Performed: non   Protocol: standard Bruce - maximal  Max BP:  177/81  Max MPHR (bpm):  170 85% MPR (bpm):  144  MPHR obtained (bpm):  166 % MPHR obtained:  97  Reached 85% MPHR (min:sec):  1:15 Total Exercise Time (min-sec):  4   Workload in METS:  5.8 Borg Scale: 15  Reason ETT Terminated:  dyspnea Dr. Stanford Breed approved Pt. Leaving with HR   ST Segment Analysis At Rest: normal ST segments - no evidence of significant ST depression With Exercise: no evidence of significant ST depression  Other Information Arrhythmia:  No Angina during ETT:  absent (0) Quality of ETT:  diagnostic  ETT Interpretation:  normal - no evidence of ischemia by ST analysis  Comments: The patient had a poor exercise tolerance.  There was no chest pain.  There was an appropriate level of dyspnea.  There were no arrhythmias, a accelerated heart rate response and normal BP response.  There were no ischemic ST T wave changes and an abnormal heart rate recovery.  Recommendations: There was no evidence of ischemia.  However, she did have a poor exercise tolerance and abnormal heart rate recovery.  Her Duke score was 0.  This all indicates moderate 10 year risk for cardiovascular events.  Risk reduction is advised.

## 2013-10-23 ENCOUNTER — Telehealth: Payer: Self-pay | Admitting: *Deleted

## 2013-10-23 DIAGNOSIS — R002 Palpitations: Secondary | ICD-10-CM

## 2013-10-23 DIAGNOSIS — I10 Essential (primary) hypertension: Secondary | ICD-10-CM

## 2013-10-23 NOTE — Telephone Encounter (Signed)
Message copied by Nuala Alpha on Mon Oct 23, 2013  2:38 PM ------      Message from: Dorothy Spark      Created: Mon Oct 23, 2013  1:52 PM       All of her labs and echocardiogram were normal, normal LVEF.      Her stress test was normal but she has had very poor exercise tolerance.      We recommend that she starts exercising (walking 3-5 times per week).       We are missing her lipids. If you could schedule her that would complete her work up.      Follow up in 6 months. ------

## 2013-10-23 NOTE — Telephone Encounter (Signed)
Informed pt of her labs, echo were normal, and normal LVEF per Dr Meda Coffee.  Informed the pt that per Dr Meda Coffee her stress test was normal, but she has very poor exercise tolerance.  Informed the pt that Dr Meda Coffee recommends her to start walking 3-5 times per week.  Also informed her that per Dr Meda Coffee she needs to have her cholesterol checked, and her full work-up will be complete, and then we will schedule her for a 6 month f/u ov with Dr Meda Coffee.  Informed pt to come to lab fasting. Scheduled pt for a lab appt for 8/18 to have lipids checked and placed 6 mnth recall into epic.  Pt verbalized understanding and agrees with this plan.

## 2013-10-24 ENCOUNTER — Other Ambulatory Visit: Payer: BC Managed Care – PPO

## 2013-12-27 ENCOUNTER — Other Ambulatory Visit (INDEPENDENT_AMBULATORY_CARE_PROVIDER_SITE_OTHER): Payer: BC Managed Care – PPO | Admitting: *Deleted

## 2013-12-27 DIAGNOSIS — R002 Palpitations: Secondary | ICD-10-CM

## 2013-12-27 DIAGNOSIS — I10 Essential (primary) hypertension: Secondary | ICD-10-CM

## 2013-12-27 LAB — LIPID PANEL
Cholesterol: 172 mg/dL (ref 0–200)
HDL: 55.6 mg/dL (ref >39.00)
LDL Cholesterol: 103 mg/dL — ABNORMAL HIGH (ref 0–99)
NonHDL: 116.4
Total CHOL/HDL Ratio: 3
Triglycerides: 67 mg/dL (ref 0.0–149.0)
VLDL: 13.4 mg/dL (ref 0.0–40.0)

## 2014-01-05 ENCOUNTER — Encounter: Payer: Self-pay | Admitting: Internal Medicine

## 2014-01-17 ENCOUNTER — Encounter: Payer: Self-pay | Admitting: *Deleted

## 2014-01-24 ENCOUNTER — Ambulatory Visit (INDEPENDENT_AMBULATORY_CARE_PROVIDER_SITE_OTHER): Payer: BC Managed Care – PPO | Admitting: Internal Medicine

## 2014-01-24 ENCOUNTER — Encounter: Payer: Self-pay | Admitting: Internal Medicine

## 2014-01-24 VITALS — BP 116/74 | HR 80 | Ht 62.75 in | Wt 177.0 lb

## 2014-01-24 DIAGNOSIS — K219 Gastro-esophageal reflux disease without esophagitis: Secondary | ICD-10-CM

## 2014-01-24 DIAGNOSIS — R1310 Dysphagia, unspecified: Secondary | ICD-10-CM

## 2014-01-24 DIAGNOSIS — Z1211 Encounter for screening for malignant neoplasm of colon: Secondary | ICD-10-CM

## 2014-01-24 MED ORDER — MOVIPREP 100 G PO SOLR: 1.0000 | Freq: Once | ORAL | Status: DC

## 2014-01-24 MED ORDER — PANTOPRAZOLE SODIUM 40 MG PO TBEC: 40.0000 mg | DELAYED_RELEASE_TABLET | Freq: Every day | ORAL | Status: DC

## 2014-01-24 NOTE — Patient Instructions (Addendum)
You have been scheduled for an endoscopy and colonoscopy. Please follow the written instructions given to you at your visit today. Please pick up your prep at the pharmacy within the next 1-3 days. If you use inhalers (even only as needed), please bring them with you on the day of your procedure. Your physician has requested that you go to www.startemmi.com and enter the access code given to you at your visit today. This web site gives a general overview about your procedure. However, you should still follow specific instructions given to you by our office regarding your preparation for the procedure.  We have sent the following medications to your pharmacy for you to pick up at your convenience: Protonix 40 mg daily  You have been scheduled for a Barium Esophogram at [facility] (1st floor of the hospital) on 02/02/14 at 9:30 am. Please arrive 15 minutes prior to your appointment for registration. Make certain not to have anything to eat or drink 6 hours prior to your test. If you need to reschedule for any reason, please contact radiology at [phone] to do so. __________________________________________________________________ A barium swallow is an examination that concentrates on views of the esophagus. This tends to be a double contrast exam (barium and two liquids which, when combined, create a gas to distend the wall of the oesophagus) or single contrast (non-ionic iodine based). The study is usually tailored to your symptoms so a good history is essential. Attention is paid during the study to the form, structure and configuration of the esophagus, looking for functional disorders (such as aspiration, dysphagia, achalasia, motility and reflux) EXAMINATION You may be asked to change into a gown, depending on the type of swallow being performed. A radiologist and radiographer will perform the procedure. The radiologist will advise you of the type of contrast selected for your  procedure and direct you during the exam. You will be asked to stand, sit or lie in several different positions and to hold a small amount of fluid in your mouth before being asked to swallow while the imaging is performed .In some instances you may be asked to swallow barium coated marshmallows to assess the motility of a solid food bolus. The exam can be recorded as a digital or video fluoroscopy procedure. POST PROCEDURE It will take 1-2 days for the barium to pass through your system. To facilitate this, it is important, unless otherwise directed, to increase your fluids for the next 24-48hrs and to resume your normal diet.  This test typically takes about 30 minutes to perform. __________________________________________________________________________________

## 2014-01-24 NOTE — Progress Notes (Signed)
Patient ID: Lindsey Conley, female   DOB: [birth year], 53 y.o.   MRN: [id number] HPI: Lindsey Conley is a 53 yo female with PMH of GERD and chronic dysphagia, anxiety, migraines, HTN, gallstones who is seen to evaluate trouble swallowing. She is here today with her husband and 17 year old son. She reports that she has had years, dating back to the 1990s, of trouble with solid food dysphagia. This is been evaluated previously by Dr. Docia Conley in [place] and she recalls 2 prior endoscopies with dilation. She reports that dilations didn't seem to help. Recall possibility of eosinophilic esophagitis. I do not have old records today. She reports she has had heartburn before, but this is not a daily problem for her. She is not taking anything for heartburn. She reports that solid foods, particularly foods like chicken and rice will get stuck in her esophagus. At times this can last for minutes to almost an hour. During this time she has trouble with her secretions and reports foamy material coming back up. She has never had a food impaction requiring endoscopy. She denies nausea, vomiting or abdominal pain. Spicy or acidic foods such as lemonade makes her swallowing symptoms worse. She was using Rolaids which helped but seemed to not help recently. She reports bowel movements a been regular without blood in her stool or melena. She has never had a colonoscopy. She denies a family history of GI tract malignancy or IBD. Her father had prostate cancer.she does report that she is perimenopausal and over the last several months has had increasing trouble with anxiety. She's also concerned about weight gain. She has a prescription for Xanax but she rarely uses this medication. She's also been prescribed Celexa for anxiety but she did not take it over concern for weight gain. She wonders if there is another medication which may help with her anxiety that would not cause weight gain  Past Medical History  Diagnosis Date  .  Anxiety   . Migraine   . Tired   . Palpitations     echo and Holter in 2009 were normal  . Anemia   . Dyspnea   . HTN (hypertension)   . History of colonoscopy   . Gallstone   . Status post dilation of esophageal narrowing     Past Surgical History  Procedure Laterality Date  . Hysteroscopy    . Dilation and curettage of uterus      Outpatient Prescriptions Prior to Visit  Medication Sig Dispense Refill  . ALPRAZolam (XANAX) 0.25 MG tablet Take 0.25 mg by mouth at bedtime as needed.      [name] ibuprofen (ADVIL,MOTRIN) 100 MG chewable tablet Chew 100 mg by mouth every 8 (eight) hours as needed.      [name] MAGNESIUM PO Take by mouth as needed.    . Multiple Vitamin (MULTIVITAMIN) tablet Take 1 tablet by mouth daily.       No facility-administered medications prior to visit.    Allergies  Allergen Reactions  . Phenergan [Promethazine Hcl]     Rash  . Promethazine Hcl   . Penicillins Palpitations and Rash    Family History  Problem Relation Age of Onset  . Asthma Mother 58  . Other Mother     palpitations  . Hypertension Father 18  . Other Sister 22    platelet disorder  . Prostate cancer Father     History  Substance Use Topics  . Smoking status: Never Smoker   . Smokeless tobacco: Never Used  .  Alcohol Use: 0.0 oz/week    0 Not specified per week     Comment: wine occasional    ROS: As per history of present illness, otherwise negative  BP 116/74 mmHg  Pulse 80  Ht 5' 2.75" (1.594 m)  Wt 177 lb (80.287 kg)  BMI 31.60 kg/m2  LMP 12/21/2013 Constitutional: Well-developed and well-nourished. No distress. HEENT: Normocephalic and atraumatic. Oropharynx is clear and moist. No oropharyngeal exudate. Conjunctivae are normal.  No scleral icterus. Neck: Neck supple. Trachea midline. Cardiovascular: Normal rate, regular rhythm and intact distal pulses. No M/R/G Pulmonary/chest: Effort normal and breath sounds normal. No wheezing, rales or rhonchi. Abdominal: Soft,  nontender, nondistended. Bowel sounds active throughout. There are no masses palpable. No hepatosplenomegaly. Extremities: no clubbing, cyanosis, or edema Lymphadenopathy: No cervical adenopathy noted. Neurological: Alert and oriented to person place and time. Skin: Skin is warm and dry. No rashes noted. Psychiatric: Normal mood and affect. Behavior is normal.  RELEVANT LABS AND IMAGING: CBC    Component Value Date/Time   WBC 7.4 08/25/2013 1151   RBC 4.55 08/25/2013 1151   HGB 12.9 08/25/2013 1151   HCT 38.4 08/25/2013 1151   PLT 221.0 08/25/2013 1151   MCV 84.4 08/25/2013 1151   MCHC 33.7 08/25/2013 1151   RDW 15.3 08/25/2013 1151   LYMPHSABS 2.0 08/25/2013 1151   MONOABS 0.7 08/25/2013 1151   EOSABS 0.1 08/25/2013 1151   BASOSABS 0.0 08/25/2013 1151    CMP     Component Value Date/Time   NA 138 08/25/2013 1151   K 4.2 08/25/2013 1151   CL 103 08/25/2013 1151   CO2 29 08/25/2013 1151   GLUCOSE 91 08/25/2013 1151   BUN 6 08/25/2013 1151   CREATININE 0.5 08/25/2013 1151   CALCIUM 9.1 08/25/2013 1151   PROT 6.5 08/25/2013 1151   ALBUMIN 3.7 08/25/2013 1151   AST 21 08/25/2013 1151   ALT 29 08/25/2013 1151   ALKPHOS 48 08/25/2013 1151   BILITOT 1.0 08/25/2013 1151   GFRNONAA >60 08/02/2007 1610   GFRAA  08/02/2007 1610    >60        The eGFR has been calculated using the MDRD equation. This calculation has not been validated in all clinical    ASSESSMENT/PLAN:  53 yo female with PMH of GERD and chronic dysphagia, anxiety, migraines, HTN, gallstones who is seen to evaluate trouble swallowing.   1. Dysphagia, chronic with GERD -- I'm highly suspicious for eosinophilic esophagitis. Records from previous endoscopy with biopsy has been requested. I would like her to start PPI with pantoprazole 40 mg daily. We discussed eosinophilic esophagitis and how this is often driven by reflux. 40% of patients with EoE will improve with PPI alone. She may require topical budesonide  or fluticasone therapy. Given the lack of prior response to upper endoscopy with dilation, before repeating upper endoscopy I would liketo obtain barium swallow with tablet. EGD including the risks and benefits of dilation was discussed and she is agreeable to proceed. I also would like to review prior endoscopy and pathology records. In the meantime I advised to soft diet and chewing food extremely well. She will be scheduled for upper endoscopy for later in December, but this may be canceled if she responds to therapy or if indeed she does have eosinophilic esophagitis which we would then treat.  2. CRC screening -- colonoscopy recommended. Procedure discussed in detail including the risks and benefits and she is agreeable to proceed.

## 2014-02-02 ENCOUNTER — Ambulatory Visit (HOSPITAL_COMMUNITY): Payer: BC Managed Care – PPO

## 2014-02-08 ENCOUNTER — Ambulatory Visit (HOSPITAL_COMMUNITY): Payer: BC Managed Care – PPO

## 2014-02-14 ENCOUNTER — Encounter: Payer: Self-pay | Admitting: Internal Medicine

## 2014-02-15 ENCOUNTER — Encounter: Payer: Self-pay | Admitting: Internal Medicine

## 2014-02-16 ENCOUNTER — Ambulatory Visit (HOSPITAL_COMMUNITY)
Admission: RE | Admit: 2014-02-16 | Discharge: 2014-02-16 | Disposition: A | Discharge status: Home / Self Care | Payer: BC Managed Care – PPO | Source: Ambulatory Visit | Attending: Internal Medicine | Admitting: Internal Medicine

## 2014-02-16 DIAGNOSIS — R1310 Dysphagia, unspecified: Secondary | ICD-10-CM | POA: Diagnosis not present

## 2014-02-16 DIAGNOSIS — K219 Gastro-esophageal reflux disease without esophagitis: Secondary | ICD-10-CM | POA: Insufficient documentation

## 2014-02-16 DIAGNOSIS — Z1211 Encounter for screening for malignant neoplasm of colon: Secondary | ICD-10-CM

## 2014-02-17 ENCOUNTER — Encounter: Payer: Self-pay | Admitting: Internal Medicine

## 2014-02-19 NOTE — Telephone Encounter (Signed)
Pt asking about need for EGD after barium swallow She does have a stricture that would likely improve with dilation Given her hx of GERD and dysphagia, I would recommend proceeding to EGD with possible dilation Thanks

## 2014-02-19 NOTE — Telephone Encounter (Signed)
Caller name: Eladia Call back number: [phone]   Reason for call:  Pt would like a return call to discuss if she needs the Endoscopy; would also like to cancel the colonoscopy as well.

## 2014-02-26 ENCOUNTER — Telehealth: Payer: Self-pay | Admitting: Internal Medicine

## 2014-02-26 NOTE — Telephone Encounter (Signed)
No charge Vaughan Basta, she will need to reschedule

## 2014-02-26 NOTE — Telephone Encounter (Signed)
Pt states she will call back to reschedule the procedure.

## 2014-02-28 ENCOUNTER — Encounter: Payer: BC Managed Care – PPO | Admitting: Internal Medicine

## 2014-08-09 ENCOUNTER — Encounter: Payer: Self-pay | Admitting: *Deleted

## 2014-08-10 ENCOUNTER — Ambulatory Visit (INDEPENDENT_AMBULATORY_CARE_PROVIDER_SITE_OTHER): Payer: BLUE CROSS/BLUE SHIELD | Admitting: Cardiology

## 2014-08-10 ENCOUNTER — Encounter: Payer: Self-pay | Admitting: *Deleted

## 2014-08-10 VITALS — BP 128/72 | HR 88 | Ht 62.75 in | Wt 184.0 lb

## 2014-08-10 DIAGNOSIS — E785 Hyperlipidemia, unspecified: Secondary | ICD-10-CM | POA: Insufficient documentation

## 2014-08-10 DIAGNOSIS — R002 Palpitations: Secondary | ICD-10-CM | POA: Diagnosis not present

## 2014-08-10 DIAGNOSIS — R0609 Other forms of dyspnea: Secondary | ICD-10-CM

## 2014-08-10 DIAGNOSIS — R0600 Dyspnea, unspecified: Secondary | ICD-10-CM | POA: Insufficient documentation

## 2014-08-10 NOTE — Progress Notes (Signed)
Patient ID: Lindsey Conley, female   DOB: 1961/02/16, 54 y.o.   MRN: [id number]    Patient Name: Lindsey Conley Date of Encounter: 08/10/2014  Primary Care Provider:  [facility] Primary Cardiologist:  Dorothy Spark  Problem List   Past Medical History  Diagnosis Date  . Anxiety   . Migraine   . Tired   . Palpitations     echo and Holter in 2009 were normal  . Anemia   . Dyspnea   . HTN (hypertension)   . History of colonoscopy   . Gallstone   . Status post dilation of esophageal narrowing    Past Surgical History  Procedure Laterality Date  . Hysteroscopy    . Dilation and curettage of uterus      Allergies  Allergies  Allergen Reactions  . Phenergan [Promethazine Hcl]     Rash  . Promethazine Hcl   . Penicillins Palpitations and Rash   HPI  A very pleasant 54 year old female who just reach her menopause with no significant past medical history who is coming with concerns of exertional shortness of breath and fatigue. The patient was seen by Dr. Britta Mccreedy on in the past as a part of family screening. Patient's husband has history of hypertrophic cardiomyopathy and underwent a myectomy in the past. As a result 5 of her kids were tested but none was proven to have hypertrophic cardiomyopathy dilatated. 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 She is very active however recently she has been feeling exhausted and gets short of breath with for example walking 2 flight of stairs. She denies any chest pain, back pain jaw pain or arm pain. She denies any palpitations. Denies any lower extremity edema.  08/10/2014 - the patient is coming after 1 year, she underwent stress test - ETT negative for ischemia but prro exercise tolerance, echo showed normal biventricular function, mild MR. She hasn't been exercising, going through menopause, denies chest pain, SOB, palpitations, syncope, claudications.     Home Medications  Prior to Admission medications   Medication Sig Start Date End Date Taking? Authorizing Provider  ALPRAZolam (XANAX) 0.25 MG tablet Take 0.25 mg by mouth at bedtime as needed.     Yes Historical Provider, MD  ibuprofen (ADVIL,MOTRIN) 100 MG chewable tablet Chew 100 mg by mouth every 8 (eight) hours as needed.     Yes Historical Provider, MD  MAGNESIUM PO Take by mouth as needed.   Yes Historical Provider, MD  Multiple Vitamin (MULTIVITAMIN) tablet Take 1 tablet by mouth daily.     Yes Historical Provider, MD    Family History  Family History  Problem Relation Age of Onset  . Asthma Mother 17  . Other Mother     palpitations  . Hypertension Father 44  . Other Sister 23    platelet disorder  . Prostate cancer Father     Social History  History   Social History  . Marital Status: Married    Spouse Name: N/A  . Number of Children: 5  . Years of Education: N/A   Occupational History  . care giver    Social History Main Topics  . Smoking status: Never Smoker   . Smokeless tobacco: Never Used  . Alcohol Use: 0.0 oz/week    0 Standard drinks or equivalent per week     Comment: wine occasional  . Drug Use: No  . Sexual Activity:  Not on file   Other Topics Concern  . Not on file   Social History Narrative     Review of Systems, as per HPI, otherwise negative General:  No chills, fever, night sweats or weight changes.  Cardiovascular:  No chest pain, dyspnea on exertion, edema, orthopnea, palpitations, paroxysmal nocturnal dyspnea. Dermatological: No rash, lesions/masses Respiratory: No cough, dyspnea Urologic: No hematuria, dysuria Abdominal:   No nausea, vomiting, diarrhea, bright red blood per rectum, melena, or hematemesis Neurologic:  No visual changes, wkns, changes in mental status. All other systems reviewed and are otherwise negative except as noted above.  Physical Exam  Blood pressure 128/72, pulse 88, height 5' 2.75" (1.594 m),  weight 184 lb (83.462 kg).  General: Pleasant, NAD Psych: Normal affect. Neuro: Alert and oriented X 3. Moves all extremities spontaneously. HEENT: Normal  Neck: Supple without bruits or JVD. Lungs:  Resp regular and unlabored, CTA. Heart: RRR no s3, s4, dear a mild systolic and diastolic murmur.. Abdomen: Soft, non-tender, non-distended, BS + x 4.  Extremities: No clubbing, cyanosis or edema. DP/PT/Radials 2+ and equal bilaterally.  Labs:  No results for input(s): CKTOTAL, CKMB, TROPONINI in the last 72 hours. Lab Results  Component Value Date   WBC 7.4 08/25/2013   HGB 12.9 08/25/2013   HCT 38.4 08/25/2013   MCV 84.4 08/25/2013   PLT 221.0 08/25/2013    No results found for: DDIMER Invalid input(s): POCBNP    Component Value Date/Time   NA 138 08/25/2013 1151   K 4.2 08/25/2013 1151   CL 103 08/25/2013 1151   CO2 29 08/25/2013 1151   GLUCOSE 91 08/25/2013 1151   BUN 6 08/25/2013 1151   CREATININE 0.5 08/25/2013 1151   CALCIUM 9.1 08/25/2013 1151   PROT 6.5 08/25/2013 1151   ALBUMIN 3.7 08/25/2013 1151   AST 21 08/25/2013 1151   ALT 29 08/25/2013 1151   ALKPHOS 48 08/25/2013 1151   BILITOT 1.0 08/25/2013 1151   GFRNONAA >60 08/02/2007 1610   GFRAA  08/02/2007 1610    >60        The eGFR has been calculated using the MDRD equation. This calculation has not been validated in all clinical   Lab Results  Component Value Date   CHOL 172 12/27/2013   HDL 55.60 12/27/2013   LDLCALC 103* 12/27/2013   TRIG 67.0 12/27/2013    Accessory Clinical Findings  Echocardiogram - 10/2013 Left ventricle: The cavity size was normal. Wall thickness was normal. Systolic function was normal. The estimated ejection fraction was in the range of 60% to 65%. - Aortic valve: There was mild regurgitation.  ECG - normal sinus rhythm, normal EKG, unchanged from EKG in 2012  ETT: 10/2013 Comments: The patient had a poor exercise tolerance. There was no chest  pain. There was  an appropriate level of dyspnea. There were no  arrhythmias, a accelerated heart rate response and normal BP  response. There were no ischemic ST T wave changes and an  abnormal heart rate recovery.  Recommendations: There was no evidence of ischemia. However, she did have a poor  exercise tolerance and abnormal heart rate recovery. Her Duke  score was 0. This all indicates moderate 10 year risk for  cardiovascular events. Risk reduction is advised.   ECG: unchanged from the last year, low voltage ECG, otherwise normal.  Assessment & Plan  A very pleasant 54 year old female  1. profound fatigue and dyspnea on exertion - risk factors include obesity and being postmenopausal. We'll  checked labs including comprehensive metabolic profile CBC and TSH, all normal, normal echo, normal ETT, poor exercise tolerance, once again educated about proper diet and exercise, type and frequency.  2. blood pressure - well controlled  3. Lipids - LDL 103, medical management only  Follow up in 2 year.  Dorothy Spark, MD, [facility] 08/10/2014, 4:21 PM

## 2014-08-10 NOTE — Patient Instructions (Signed)
Your physician recommends that you continue on your current medications as directed. Please refer to the Current Medication list given to you today.    Your physician wants you to follow-up in: 2 [place] will receive a reminder letter in the mail two months in advance. If you don't receive a letter, please call our office to schedule the follow-up appointment.

## 2015-05-10 ENCOUNTER — Encounter: Payer: Self-pay | Admitting: Internal Medicine

## 2016-09-08 ENCOUNTER — Ambulatory Visit: Payer: BLUE CROSS/BLUE SHIELD | Admitting: Physician Assistant

## 2016-09-08 ENCOUNTER — Encounter: Payer: Self-pay | Admitting: Physician Assistant

## 2016-09-08 NOTE — Progress Notes (Deleted)
Cardiology Office Note    Date:  04/11/3005  ID:  Lindsey Conley, DOB [birth year], MRN [id number] PCP:  Clinic, General Medical  Cardiologist:  Dr. Meda Coffee   Chief Complaint: ***  History of Present Illness:  Lindsey Conley is a 56 y.o. 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  She was remotely followed in our clinic for palpitations with prior normal workup. Remote holter reportedly normal. Echo in 2008 was unremarkable. OV in 2012 with Dr. Haroldine Laws raised question of symptoms r/t panic attacks. F/u echo 10/2013 showed EF 60-65%, mild AI. ETT 10/2013 showed poor exercise tolerance but no evidence of ischemia (total exercise time 69min) - walking program recommended. Last labs 2015 showed normal thyroid, CBC, CMET, LDL 103, HDL 55, trig 67.   consider OSA visit dx  Dyspnea H/o Palpitations Mild AI by echo 2015 Obesity    Past Medical History:  Diagnosis Date  . Anemia   . Anxiety   . Dyspnea    a. ETT 2015: poor exercise tolerance but nonischemic.  . Gallstone   . History of colonoscopy   . HTN (hypertension)   . Migraine   . Mild AI (aortic insufficiency)    a. by echo 2015.  [name] Palpitations    echo and Holter in 2009 were normal  . Status post dilation of esophageal narrowing     Past Surgical History:  Procedure Laterality Date  . DILATION AND CURETTAGE OF UTERUS    . HYSTEROSCOPY      Current Medications: No outpatient prescriptions have been marked as taking for the 09/08/16 encounter (Appointment) with Charlie Pitter, PA-C.     Allergies:   Phenergan [promethazine hcl]; Promethazine hcl; and Penicillins   Social History   Social History  . Marital status: Married    Spouse name: N/A  . Number of children: 5  . Years of education: N/A   Occupational History  . care giver    Social History Main Topics  . Smoking status: Never Smoker  . Smokeless  tobacco: Never Used  . Alcohol use 0.0 oz/week     Comment: wine occasional  . Drug use: No  . Sexual activity: Not on file   Other Topics Concern  . Not on file   Social History Narrative  . No narrative on file     Family History:  Family History  Problem Relation Age of Onset  . Asthma Mother 19  . Other Mother        palpitations  . Hypertension Father 67  . Other Sister 44       platelet disorder  . Prostate cancer Father    ***  ROS:   Please see the history of present illness. Otherwise, review of systems is positive for ***.  All other systems are reviewed and otherwise negative.    PHYSICAL EXAM:   VS:  There were no vitals taken for this visit.  BMI: There is no height or weight on file to calculate BMI. GEN: Well nourished, well developed, in no acute distress  HEENT: normocephalic, atraumatic Neck: no JVD, carotid bruits, or masses Cardiac: ***RRR; no murmurs, rubs, or gallops, no edema  Respiratory:  clear to auscultation bilaterally, normal work of breathing GI: soft, nontender, nondistended, + BS MS: no deformity or atrophy  Skin: warm and dry, no rash Neuro:  Alert and Oriented x 3, Strength and sensation are intact, follows  commands Psych: euthymic mood, full affect  Wt Readings from Last 3 Encounters:  08/10/14 184 lb (83.5 kg)  01/24/14 177 lb (80.3 kg)  08/25/13 176 lb 12.8 oz (80.2 kg)      Studies/Labs Reviewed:   EKG:  EKG was ordered today and personally reviewed by me and demonstrates *** EKG was not ordered today.***  Recent Labs: No results found for requested labs within last 8760 hours.   Lipid Panel    Component Value Date/Time   CHOL 172 12/27/2013 0958   TRIG 67.0 12/27/2013 0958   HDL 55.60 12/27/2013 0958   CHOLHDL 3 12/27/2013 0958   VLDL 13.4 12/27/2013 0958   LDLCALC 103 (H) 12/27/2013 0958    Additional studies/ records that were reviewed today include: Summarized above.***    ASSESSMENT & PLAN:    1. ***  Disposition: F/u with ***   Medication Adjustments/Labs and Tests Ordered: Current medicines are reviewed at length with the patient today.  Concerns regarding medicines are outlined above. Medication changes, Labs and Tests ordered today are summarized above and listed in the Patient Instructions accessible in Encounters.   Signed, Charlie Pitter, PA-C  09/08/2016 7:51 AM    [address], [place], [place]  [id number] Phone: [phone]; Fax: [phone]

## 2016-10-01 ENCOUNTER — Telehealth: Payer: Self-pay | Admitting: Physician Assistant

## 2016-10-01 ENCOUNTER — Ambulatory Visit (INDEPENDENT_AMBULATORY_CARE_PROVIDER_SITE_OTHER): Payer: BLUE CROSS/BLUE SHIELD | Admitting: Physician Assistant

## 2016-10-01 ENCOUNTER — Encounter: Payer: Self-pay | Admitting: Physician Assistant

## 2016-10-01 VITALS — BP 126/84 | HR 75 | Ht 63.0 in | Wt 178.4 lb

## 2016-10-01 DIAGNOSIS — R0600 Dyspnea, unspecified: Secondary | ICD-10-CM

## 2016-10-01 DIAGNOSIS — E669 Obesity, unspecified: Secondary | ICD-10-CM

## 2016-10-01 DIAGNOSIS — R002 Palpitations: Secondary | ICD-10-CM | POA: Diagnosis not present

## 2016-10-01 DIAGNOSIS — I351 Nonrheumatic aortic (valve) insufficiency: Secondary | ICD-10-CM

## 2016-10-01 NOTE — Patient Instructions (Signed)
Medication Instructions:  Your physician recommends that you continue on your current medications as directed. Please refer to the Current Medication list given to you today.   Labwork: Bmet, Mag, Cbc, Tsh today  Testing/Procedures: Your physician has requested that you have an echocardiogram. Echocardiography is a painless test that uses sound waves to create images of your heart. It provides your doctor with information about the size and shape of your heart and how well your heart's chambers and valves are working. This procedure takes approximately one hour. There are no restrictions for this procedure.  Your physician has recommended that you wear an event monitor. Event monitors are medical devices that record the heart's electrical activity. Doctors most often [place] these monitors to diagnose arrhythmias. Arrhythmias are problems with the speed or rhythm of the heartbeat. The monitor is a small, portable device. You can wear one while you do your normal daily activities. This is usually used to diagnose what is causing palpitations/syncope (passing out).    Follow-Up: Your physician wants you to follow-up in: 1 year with Dr.Nelson You will receive a reminder letter in the mail two months in advance. If you don't receive a letter, please call our office to schedule the follow-up appointment.   Any Other Special Instructions Will Be Listed Below (If Applicable).     If you need a refill on your cardiac medications before your next appointment, please call your pharmacy.

## 2016-10-01 NOTE — Telephone Encounter (Deleted)
Hi Lindsey Conley, I saw this patient of yours back in clinic today as a post-cath check. Recently had TEE to evaluate moderate-severe MR, and cath which showed normal coronaries. She was recently inactivated from the transplant list, contingent on further plans for mitral valve. See note. I do not see the results of the TEE made it back your way, [name]. Can you review and get with [name] to decide next steps for this patient? I reviewed with Dr. Angelena Form (DOD) today and he thought it would be best if you personally reviewed since you know this patient well. I told the patient someone from the office would call her next week with further recommendations once you're back in the office. Thank you so much!  Charman Blasco PA-C

## 2016-10-01 NOTE — Progress Notes (Signed)
Cardiology Office Note    Date:  1/94/1740  ID:  Lindsey Conley, DOB [birth year], MRN [id number] PCP:  Clinic, General Medical  Cardiologist:  Dr. Meda Coffee   Chief Complaint: routine check-up for palpitations  History of Present Illness:  Lindsey Conley is a 56 y.o. 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   She was remotely followed in our clinic for palpitations with prior normal workup. Remote holter reportedly normal. Echo in 2008 was unremarkable. OV in 2012 with Dr. Haroldine Laws raised question of symptoms r/t panic attacks. F/u echo 10/2013 showed EF 60-65%, mild AI. ETT 10/2013 showed poor exercise tolerance but no evidence of ischemia (total exercise time 45min) - walking program recommended. Last labs 2015 showed normal thyroid, CBC, CMET, LDL 103, HDL 55, trig 67.   She returns for follow-up overall feeling stable. She has chronic DOE unchanged. No chest pain. Overall she states she feels "bad" most of the time but this is not new for her. She states she has constant anxiety and often puts others before herself. She takes care of children and a grandchild and devotes very little time to care for herself. She's seen a psychiatrist in the past for her anxiety but at that time was not interested in pharmacologic therapy. She has frequent headaches exacerbated by dairy, improved by cutting this out. She does report episodes of palpitations about 1-2 times a week, described as rapid heart rates in a row, for a few seconds. No specific trigger other than when she increases caffeine intake (which she's been trying to cut down). No syncope.  Past Medical History:  Diagnosis Date  . Anemia   . Anxiety   . Dyspnea    a. ETT 2015: poor exercise tolerance but nonischemic.  . Gallstone   . History of colonoscopy   . HTN (hypertension)   . Migraine   . Mild AI (aortic insufficiency)    a. by  echo 2015.  [name] Palpitations    echo and Holter in 2009 were normal  . Status post dilation of esophageal narrowing     Past Surgical History:  Procedure Laterality Date  . DILATION AND CURETTAGE OF UTERUS    . HYSTEROSCOPY      Current Medications: Current Meds  Medication Sig  . ALPRAZolam (XANAX) 0.25 MG tablet Take 0.25 mg by mouth at bedtime as needed.    [name] ibuprofen (ADVIL,MOTRIN) 100 MG chewable tablet Chew 100 mg by mouth every 8 (eight) hours as needed.    [name] MAGNESIUM PO Take by mouth as needed.  . Multiple Vitamin (MULTIVITAMIN) tablet Take 1 tablet by mouth daily.       Allergies:   Phenergan [promethazine hcl]; Promethazine hcl; and Penicillins   Social History   Social History  . Marital status: Married    Spouse name: N/A  . Number of children: 5  . Years of education: N/A   Occupational History  . care giver    Social History Main Topics  . Smoking status: Never Smoker  . Smokeless tobacco: Never Used  . Alcohol use 0.0 oz/week     Comment: wine occasional  . Drug use: No  . Sexual activity: Not Asked   Other Topics Concern  . None   Social History Narrative  . None     Family History:  Family History  Problem Relation Age of Onset  . Asthma Mother 73  .  Other Mother        palpitations  . Hypertension Father 41  . Prostate cancer Father   . Other Sister 62       platelet disorder     ROS:   Please see the history of present illness.  All other systems are reviewed and otherwise negative.    PHYSICAL EXAM:   VS:  BP 126/84   Pulse 75   Ht 5\' 3"  (1.6 m)   Wt 178 lb 6.4 oz (80.9 kg)   SpO2 98%   BMI 31.60 kg/m   BMI: Body mass index is 31.6 kg/m. GEN: Well nourished, well developed obese WF in no acute distress  HEENT: normocephalic, atraumatic Neck: no JVD, carotid bruits, or masses Cardiac: RRR; no murmurs, rubs, or gallops, no edema  Respiratory:  clear to auscultation bilaterally, normal work of breathing GI: soft,  nontender, nondistended, + BS MS: no deformity or atrophy  Skin: warm and dry, no rash Neuro:  Alert and Oriented x 3, Strength and sensation are intact, follows commands Psych: euthymic mood, full affect  Wt Readings from Last 3 Encounters:  10/01/16 178 lb 6.4 oz (80.9 kg)  08/10/14 184 lb (83.5 kg)  01/24/14 177 lb (80.3 kg)      Studies/Labs Reviewed:   EKG:  EKG was ordered today and personally reviewed by me and demonstrates NSR 75bpm, no acute changes. Normal intervals.  Recent Labs: No results found for requested labs within last 8760 hours.   Lipid Panel    Component Value Date/Time   CHOL 172 12/27/2013 0958   TRIG 67.0 12/27/2013 0958   HDL 55.60 12/27/2013 0958   CHOLHDL 3 12/27/2013 0958   VLDL 13.4 12/27/2013 0958   LDLCALC 103 (H) 12/27/2013 0958    Additional studies/ records that were reviewed today include: Summarized above   ASSESSMENT & PLAN:   1. Dyspnea - stable. We discussed long-term recommendation to participate in exercise program as her dyspnea is unlikely to improve remaining inactive. EKG does not show any significant change from prior, and her dyspnea has not changed since prior nuclear stress test per patient. Will f/u echo to ensure no changes since she is coming due for it. 2. Palpitations - discussed observation vs monitor. It would be good to lay the issue to rest what these represent, if not to ease anxiety if benign. Will arrange 30-day event monitor. Check lytes and thyroid. 3. Mild AI by echo 2015 - current recommendations are to follow every 3-5 years. Last echo 2015. Will arrange repeat study, non-urgent. 4. Obesity - discussed long-term importance of taking care of self, trying to stay active, healthy habits. We also discussed talking to her PCP about anxiety as well. No SI/HI.  Disposition: F/u with Dr. Meda Coffee in 1 year.   Medication Adjustments/Labs and Tests Ordered: Current medicines are reviewed at length with the patient  today.  Concerns regarding medicines are outlined above. Medication changes, Labs and Tests ordered today are summarized above and listed in the Patient Instructions accessible in Encounters.   Signed, Charlie Pitter, PA-C  10/01/2016 2:59 PM    [facility] [address], [place], [place]  [address] Phone: [phone]; Fax: [phone]

## 2016-10-02 ENCOUNTER — Telehealth: Payer: Self-pay | Admitting: Cardiology

## 2016-10-02 LAB — BASIC METABOLIC PANEL
BUN / CREAT RATIO: 13 (ref 9–23)
BUN: 8 mg/dL (ref 6–24)
CO2: 30 mmol/L — ABNORMAL HIGH (ref 20–29)
CREATININE: 0.63 mg/dL (ref 0.57–1.00)
Calcium: 9.3 mg/dL (ref 8.7–10.2)
Chloride: 101 mmol/L (ref 96–106)
GFR calc Af Amer: 118 mL/min/{1.73_m2} (ref >59)
GFR, EST NON AFRICAN AMERICAN: 103 mL/min/{1.73_m2} (ref >59)
Glucose: 91 mg/dL (ref 65–99)
Potassium: 4 mmol/L (ref 3.5–5.2)
SODIUM: 143 mmol/L (ref 134–144)

## 2016-10-02 LAB — CBC WITH DIFFERENTIAL/PLATELET
BASOS: 1 %
Basophils Absolute: 0 10*3/uL (ref 0.0–0.2)
EOS (ABSOLUTE): 0.2 10*3/uL (ref 0.0–0.4)
EOS: 3 %
HEMATOCRIT: 38.7 % (ref 34.0–46.6)
HEMOGLOBIN: 13.4 g/dL (ref 11.1–15.9)
Immature Grans (Abs): 0 10*3/uL (ref 0.0–0.1)
Immature Granulocytes: 0 %
LYMPHS ABS: 2.2 10*3/uL (ref 0.7–3.1)
Lymphs: 34 %
MCH: 28.3 pg (ref 26.6–33.0)
MCHC: 34.6 g/dL (ref 31.5–35.7)
MCV: 82 fL (ref 79–97)
MONOCYTES: 8 %
Monocytes Absolute: 0.5 10*3/uL (ref 0.1–0.9)
NEUTROS ABS: 3.5 10*3/uL (ref 1.4–7.0)
Neutrophils: 54 %
Platelets: 230 10*3/uL (ref 150–379)
RBC: 4.73 x10E6/uL (ref 3.77–5.28)
RDW: 14.2 % (ref 12.3–15.4)
WBC: 6.5 10*3/uL (ref 3.4–10.8)

## 2016-10-02 LAB — TSH: TSH: 0.591 u[IU]/mL (ref 0.450–4.500)

## 2016-10-02 LAB — MAGNESIUM: Magnesium: 2.3 mg/dL (ref 1.6–2.3)

## 2016-10-02 NOTE — Telephone Encounter (Signed)
°  Follow Up   Returning call regarding lab work results. Please call.

## 2016-10-02 NOTE — Telephone Encounter (Signed)
You are correct! Will forward correct chart! Timeka Goette PA-C

## 2016-10-02 NOTE — Telephone Encounter (Signed)
I just looked at your note and there is nothing about cath, TEE, MR or transplant (and of which organ), is this the right patient? Also where were the acth and TEE done? There are no records.

## 2016-10-02 NOTE — Telephone Encounter (Signed)
Patient aware of lab results.

## 2016-10-07 ENCOUNTER — Ambulatory Visit: Payer: BLUE CROSS/BLUE SHIELD | Admitting: Gastroenterology

## 2016-10-22 ENCOUNTER — Other Ambulatory Visit (HOSPITAL_COMMUNITY): Payer: BLUE CROSS/BLUE SHIELD

## 2016-10-27 ENCOUNTER — Telehealth: Payer: Self-pay | Admitting: Physician Assistant

## 2016-10-27 NOTE — Telephone Encounter (Signed)
Thank you for letting me know. Dayna Dunn PA-C

## 2016-10-27 NOTE — Telephone Encounter (Signed)
Per phone operator pt has not had any more palpitations and does not want to keep appt. Wants to cancel the monitor appt.  appt has been cancelled and order has been removed.

## 2016-11-03 ENCOUNTER — Encounter: Payer: Self-pay | Admitting: Gastroenterology

## 2016-11-03 ENCOUNTER — Other Ambulatory Visit (INDEPENDENT_AMBULATORY_CARE_PROVIDER_SITE_OTHER): Payer: BLUE CROSS/BLUE SHIELD

## 2016-11-03 ENCOUNTER — Ambulatory Visit (INDEPENDENT_AMBULATORY_CARE_PROVIDER_SITE_OTHER): Payer: BLUE CROSS/BLUE SHIELD | Admitting: Gastroenterology

## 2016-11-03 VITALS — BP 118/80 | HR 88 | Ht 62.21 in | Wt 177.4 lb

## 2016-11-03 DIAGNOSIS — K8020 Calculus of gallbladder without cholecystitis without obstruction: Secondary | ICD-10-CM | POA: Diagnosis not present

## 2016-11-03 DIAGNOSIS — R1011 Right upper quadrant pain: Secondary | ICD-10-CM | POA: Diagnosis not present

## 2016-11-03 DIAGNOSIS — R1310 Dysphagia, unspecified: Secondary | ICD-10-CM

## 2016-11-03 DIAGNOSIS — Z1211 Encounter for screening for malignant neoplasm of colon: Secondary | ICD-10-CM

## 2016-11-03 DIAGNOSIS — K222 Esophageal obstruction: Secondary | ICD-10-CM | POA: Diagnosis not present

## 2016-11-03 LAB — HEPATIC FUNCTION PANEL
ALT: 21 U/L (ref 0–35)
AST: 16 U/L (ref 0–37)
Albumin: 4.1 g/dL (ref 3.5–5.2)
Alkaline Phosphatase: 43 U/L (ref 39–117)
BILIRUBIN DIRECT: 0.2 mg/dL (ref 0.0–0.3)
TOTAL PROTEIN: 7 g/dL (ref 6.0–8.3)
Total Bilirubin: 1 mg/dL (ref 0.2–1.2)

## 2016-11-03 MED ORDER — NA SULFATE-K SULFATE-MG SULF 17.5-3.13-1.6 GM/177ML PO SOLN: ORAL | 0 refills | Status: DC

## 2016-11-03 NOTE — Patient Instructions (Signed)
If you are age 54 or older, your body mass index should be between 23-30. Your Body mass index is 32.23 kg/m. If this is out of the aforementioned range listed, please consider follow up with your Primary Care Provider.  If you are age 3 or younger, your body mass index should be between 19-25. Your Body mass index is 32.23 kg/m. If this is out of the aformentioned range listed, please consider follow up with your Primary Care Provider.   You have been scheduled for an endoscopy and colonoscopy. Please follow the written instructions given to you at your visit today. Please pick up your prep supplies at the pharmacy within the next 1-3 days. If you use inhalers (even only as needed), please bring them with you on the day of your procedure. Your physician has requested that you go to www.startemmi.com and enter the access code given to you at your visit today. This web site gives a general overview about your procedure. However, you should still follow specific instructions given to you by our office regarding your preparation for the procedure.  Your physician has requested that you go to the basement for the following lab work before leaving today: Hepatic Function  You have been scheduled for an abdominal ultrasound at [facility] (1st floor of hospital) on 11/06/16 at 930 am. Please arrive 15 minutes prior to your appointment for registration. Make certain not to have anything to eat or drink after midnight the day prior to your appointment. Should you need to reschedule your appointment, please contact radiology at [phone]. This test typically takes about 30 minutes to perform.  Thank you for choosing me and [facility].   Alonza Bogus, PA-C

## 2016-11-03 NOTE — Progress Notes (Addendum)
5[birth year]73[birth year]2 Lindsey Conley [id number] 04/15/1960   HISTORY OF PRESENT ILLNESS:  This is a pleasant 56 year old female who is known to Dr. Hilarie Fredrickson for a visit in 2015. At that time she was having complaints of dysphagia with some suspicion of eosinophilic esophagitis. Esophagram showed a distal esophageal stricture. Plan was for EGD with dilation, but patient never scheduled the procedure. She returns today with ongoing dysphagia. Reporting dysphagia to solids about 2 or 3 times a week. Denies any reflux-type symptoms.  It was also recommend that she have a colonoscopy as she has never undergone one of those in the past. She denies any bowel complaints.  She reports intermittent right upper quadrant abdominal pain. She has history of a large, 2.5 cm, gallstone in the neck of the gallbladder on previous ultrasound.    Past Medical History:  Diagnosis Date  . Anemia   . Anxiety   . Dyspnea    a. ETT 2015: poor exercise tolerance but nonischemic.  . Gallstone   . History of colonoscopy   . HTN (hypertension)   . Migraine   . Mild AI (aortic insufficiency)    a. by echo 2015.  [name] Palpitations    echo and Holter in 2009 were normal  . Status post dilation of esophageal narrowing    Past Surgical History:  Procedure Laterality Date  . DILATION AND CURETTAGE OF UTERUS    . HYSTEROSCOPY      reports that she has never smoked. She has never used smokeless tobacco. She reports that she drinks alcohol. She reports that she does not use drugs. family history includes Asthma (age of onset: 44) in her mother; Hypertension (age of onset: 75) in her father; Other in her mother; Other (age of onset: 95) in her sister; Prostate cancer in her father. Allergies  Allergen Reactions  . Phenergan [Promethazine Hcl]     Rash  . Promethazine Hcl   . Penicillins Palpitations and Rash      Outpatient Encounter Prescriptions as of 11/03/2016  Medication Sig  . ALPRAZolam (XANAX) 0.25 MG tablet  Take 0.25 mg by mouth at bedtime as needed.    [name] ibuprofen (ADVIL,MOTRIN) 100 MG chewable tablet Chew 100 mg by mouth every 8 (eight) hours as needed.    [name] MAGNESIUM PO Take by mouth as needed.  . Multiple Vitamin (MULTIVITAMIN) tablet Take 1 tablet by mouth daily.    . Na Sulfate-K Sulfate-Mg Sulf 17.5-3.13-1.6 GM/180ML SOLN Suprep-Use as directed   No facility-administered encounter medications on file as of 11/03/2016.      REVIEW OF SYSTEMS  : All other systems reviewed and negative except where noted in the History of Present Illness.   PHYSICAL EXAM: BP 118/80 (BP Location: Left Arm, Patient Position: Sitting, Cuff Size: Normal)   Pulse 88   Ht 5' 2.21" (1.58 m) Comment: height measured without shoes  Wt 177 lb 6 oz (80.5 kg)   LMP 12/21/2013   BMI 32.23 kg/m  General: Well developed white female in no acute distress Head: Normocephalic and atraumatic Eyes:  Sclerae anicteric, conjunctiva pink. Ears: Normal auditory acuity Lungs: Clear throughout to auscultation; no increased WOB. Heart: Regular rate and rhythm; no M/R/G. Abdomen: Soft, non-distended.  BS present.  Non-tender. Rectal:  Will be done at the time of colonoscopy. Musculoskeletal: Symmetrical with no gross deformities  Skin: No lesions on visible extremities Extremities: No edema  Neurological: Alert oriented x 4, grossly non-focal Psychological:  Alert and cooperative. Normal mood and  affect  ASSESSMENT AND PLAN: -Dysphagia:  Had an esophagram 3 years ago that showed a distal esophageal stricture. There was also some suspicion for eosinophilic esophagitis according to her symptoms. She never underwent EGD. Will schedule EGD with dilation with Dr. Hilarie Fredrickson. Denies any reflux-type symptoms. Will likely need PPI therapy but will hold off for now. -Screening colonoscopy:  Patient never had colonoscopy in the past. Will also schedule with Dr. Hilarie Fredrickson as this was previously recommended by him 3 years ago as well. -Right  upper quadrant abdominal pain, intermittent: Patient does have history of large, 2.5 cm, gallstone at the neck of her gallbladder. Will recheck ultrasound and LFTs.  *The risks, benefits, and alternatives to EGD with dilation and colonoscopy were discussed with the patient and she consents to proceed.   CC:  Clinic, General Medical  Addendum: Reviewed and agree with initial management. Pyrtle, Lajuan Lines, MD

## 2016-11-04 ENCOUNTER — Telehealth: Payer: Self-pay | Admitting: Gastroenterology

## 2016-11-06 ENCOUNTER — Ambulatory Visit (HOSPITAL_COMMUNITY): Payer: BLUE CROSS/BLUE SHIELD

## 2016-11-17 ENCOUNTER — Ambulatory Visit (HOSPITAL_COMMUNITY)
Admission: RE | Admit: 2016-11-17 | Discharge: 2016-11-17 | Disposition: A | Discharge status: Home / Self Care | Payer: BLUE CROSS/BLUE SHIELD | Source: Ambulatory Visit | Attending: Gastroenterology | Admitting: Gastroenterology

## 2016-11-17 ENCOUNTER — Ambulatory Visit (HOSPITAL_BASED_OUTPATIENT_CLINIC_OR_DEPARTMENT_OTHER): Payer: BLUE CROSS/BLUE SHIELD

## 2016-11-17 ENCOUNTER — Other Ambulatory Visit: Payer: Self-pay

## 2016-11-17 DIAGNOSIS — I10 Essential (primary) hypertension: Secondary | ICD-10-CM | POA: Diagnosis not present

## 2016-11-17 DIAGNOSIS — I351 Nonrheumatic aortic (valve) insufficiency: Secondary | ICD-10-CM

## 2016-11-17 DIAGNOSIS — E669 Obesity, unspecified: Secondary | ICD-10-CM | POA: Diagnosis not present

## 2016-11-17 DIAGNOSIS — Z6832 Body mass index (BMI) 32.0-32.9, adult: Secondary | ICD-10-CM | POA: Insufficient documentation

## 2016-11-17 DIAGNOSIS — R1011 Right upper quadrant pain: Secondary | ICD-10-CM

## 2016-11-17 DIAGNOSIS — K8020 Calculus of gallbladder without cholecystitis without obstruction: Secondary | ICD-10-CM | POA: Diagnosis not present

## 2016-11-17 DIAGNOSIS — R932 Abnormal findings on diagnostic imaging of liver and biliary tract: Secondary | ICD-10-CM | POA: Insufficient documentation

## 2016-11-18 ENCOUNTER — Telehealth: Payer: Self-pay | Admitting: Cardiology

## 2016-11-18 NOTE — Telephone Encounter (Signed)
PT AWARE OF ECHO RESULTS./CY 

## 2016-11-18 NOTE — Telephone Encounter (Signed)
New Message     Pt would like you to call her back regarding the echo she had done yesterday

## 2016-11-25 ENCOUNTER — Telehealth: Payer: Self-pay | Admitting: Gastroenterology

## 2016-11-25 NOTE — Telephone Encounter (Signed)
Please let her know that her ultrasound confirms at least one gallstone.  Also shows fatty liver, which would not be causing pain.  Hard to tell if the gallstone is causing her RUQ pain, but it certainly could be.  I would have her undergo her EGD and colonoscopy with Dr. Hilarie Fredrickson as planned and if those are normal/ok and if she continues to have RUQ pain then she can be referred to surgeons to discuss further.

## 2016-11-25 NOTE — Telephone Encounter (Signed)
The pt has been advised and will keep procedure appt's as scheduled and will call if pain persist prior to 10/11 endo colon.  She was also advised to follow a low fat diet for the fatty liver.

## 2016-11-25 NOTE — Telephone Encounter (Signed)
Left message on machine to call back  

## 2016-11-25 NOTE — Telephone Encounter (Signed)
Jess the pt is calling for test results can you please review 9/11 [place]

## 2016-11-25 NOTE — Telephone Encounter (Signed)
Patient wanting a call to discuss results from US done on 11/17/16

## 2016-12-17 ENCOUNTER — Encounter: Payer: Self-pay | Admitting: Internal Medicine

## 2016-12-17 ENCOUNTER — Ambulatory Visit (AMBULATORY_SURGERY_CENTER): Payer: BLUE CROSS/BLUE SHIELD | Admitting: Internal Medicine

## 2016-12-17 VITALS — BP 127/82 | HR 73 | Temp 99.1°F | Resp 14 | Ht 62.0 in | Wt 177.0 lb

## 2016-12-17 DIAGNOSIS — R1310 Dysphagia, unspecified: Secondary | ICD-10-CM

## 2016-12-17 DIAGNOSIS — K219 Gastro-esophageal reflux disease without esophagitis: Secondary | ICD-10-CM | POA: Diagnosis not present

## 2016-12-17 DIAGNOSIS — R1319 Other dysphagia: Secondary | ICD-10-CM

## 2016-12-17 MED ORDER — SODIUM CHLORIDE 0.9 % IV SOLN: 500.0000 mL | INTRAVENOUS | Status: DC

## 2016-12-17 MED ORDER — PANTOPRAZOLE SODIUM 40 MG PO TBEC: 40.0000 mg | DELAYED_RELEASE_TABLET | Freq: Every day | ORAL | 3 refills | Status: DC

## 2016-12-17 NOTE — Patient Instructions (Signed)
Impression/Recommendations:  Resume previous diet. Continue present medications.  Begin pantoprazole 40 mg. Daily, 30 minutes before breakfast.  Await pathology results.  Repeat upper endoscopy in 3 weeks, with colonoscopy.  YOU HAD AN ENDOSCOPIC PROCEDURE TODAY AT [facility]:   Refer to the procedure report that was given to you for any specific questions about what was found during the examination.  If the procedure report does not answer your questions, please call your gastroenterologist to clarify.  If you requested that your care partner not be given the details of your procedure findings, then the procedure report has been included in a sealed envelope for you to review at your convenience later.  YOU SHOULD EXPECT: Some feelings of bloating in the abdomen. Passage of more gas than usual.  Walking can help get rid of the air that was put into your GI tract during the procedure and reduce the bloating. If you had a lower endoscopy (such as a colonoscopy or flexible sigmoidoscopy) you may notice spotting of blood in your stool or on the toilet paper. If you underwent a bowel prep for your procedure, you may not have a normal bowel movement for a few days.  Please Note:  You might notice some irritation and congestion in your nose or some drainage.  This is from the oxygen used during your procedure.  There is no need for concern and it should clear up in a day or so.  SYMPTOMS TO REPORT IMMEDIATELY:  Following upper endoscopy (EGD)  Vomiting of blood or coffee ground material  New chest pain or pain under the shoulder blades  Painful or persistently difficult swallowing  New shortness of breath  Fever of 100F or higher  Black, tarry-looking stools  For urgent or emergent issues, a gastroenterologist can be reached at any hour by calling [phone].   DIET:  We do recommend a small meal at first, but then you may proceed to your regular diet.  Drink plenty of  fluids but you should avoid alcoholic beverages for 24 hours.  ACTIVITY:  You should plan to take it easy for the rest of today and you should NOT DRIVE or use heavy machinery until tomorrow (because of the sedation medicines used during the test).    FOLLOW UP: Our staff will call the number listed on your records the next business day following your procedure to check on you and address any questions or concerns that you may have regarding the information given to you following your procedure. If we do not reach you, we will leave a message.  However, if you are feeling well and you are not experiencing any problems, there is no need to return our call.  We will assume that you have returned to your regular daily activities without incident.  If any biopsies were taken you will be contacted by phone or by letter within the next 1-3 weeks.  Please call us at [phone] if you have not heard about the biopsies in 3 weeks.    SIGNATURES/CONFIDENTIALITY: You and/or your care partner have signed paperwork which will be entered into your electronic medical record.  These signatures attest to the fact that that the information above on your After Visit Summary has been reviewed and is understood.  Full responsibility of the confidentiality of this discharge information lies with you and/or your care-partner.
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
## 2016-12-17 NOTE — Progress Notes (Signed)
A/ox3 pleased with MAC, report to [employer]

## 2016-12-17 NOTE — Progress Notes (Signed)
Dental advisory given to patient 

## 2016-12-17 NOTE — Progress Notes (Signed)
Called to room to assist during endoscopic procedure.  Patient ID and intended procedure confirmed with present staff. Received instructions for my participation in the procedure from the performing physician.  

## 2016-12-17 NOTE — Op Note (Signed)
[place] Patient Name: Lindsey Conley Procedure Date: 12/17/2016 2:39 PM MRN: [id number] Endoscopist: Jerene Bears , MD Age: 56 Referring MD:  Date of Birth: Sep 22, 1960 Gender: Female Account #: [id number] Procedure:                Upper GI endoscopy Indications:              Dysphagia Medicines:                Monitored Anesthesia Care Procedure:                Pre-Anesthesia Assessment:                           - Prior to the procedure, a History and Physical                            was performed, and patient medications and                            allergies were reviewed. The patient's tolerance of                            previous anesthesia was also reviewed. The risks                            and benefits of the procedure and the sedation                            options and risks were discussed with the patient.                            All questions were answered, and informed consent                            was obtained. Prior Anticoagulants: The patient has                            taken no previous anticoagulant or antiplatelet                            agents. ASA Grade Assessment: II - A patient with                            mild systemic disease. After reviewing the risks                            and benefits, the patient was deemed in                            satisfactory condition to undergo the procedure.                           After obtaining informed consent, the endoscope was  passed under direct vision. Throughout the                            procedure, the patient's blood pressure, pulse, and                            oxygen saturations were monitored continuously. The                            Endoscope was introduced through the mouth, and                            advanced to the second part of duodenum. The upper                            GI endoscopy was accomplished without  difficulty.                            The patient tolerated the procedure well. Findings:                 One moderate benign-appearing, intrinsic stenosis                            was found 32 cm from the incisors. And was                            traversed with the standard adult upper endoscope,                            and thereafter there were 4 mucosal rents in the                            stricture. There was minor oozing with spontaneous                            cessation. Due to this result with passing the                            upper endoscope, further dilation was not performed                            today..                           Otherwise normal mucosa was found in the entire                            esophagus. Biopsies were obtained from the proximal                            and distal esophagus with cold forceps for                            histology of  suspected eosinophilic esophagitis.                           A 4 cm hiatal hernia was present (from 32-36 cm                            from the incisors).                           The entire examined stomach was normal.                           The examined duodenum was normal. Complications:            No immediate complications. Estimated Blood Loss:     Estimated blood loss was minimal. Impression:               - Benign-appearing esophageal stenosis. Dilated                            with the scope.                           - Otherwise normal mucosa was found in the entire                            esophagus. Biopsied.                           - 4 cm hiatal hernia.                           - Normal stomach.                           - Normal examined duodenum. Recommendation:           - Patient has a contact number available for                            emergencies. The signs and symptoms of potential                            delayed complications were discussed with the                             patient. Return to normal activities tomorrow.                            Written discharge instructions were provided to the                            patient.                           - Resume previous diet.                           -  Continue present medications.                           - Begin pantoprazole 40 mg daily, 30 minutes before                            breakfast.                           - Await pathology results.                           - Repeat upper endoscopy for retreatment.                           - Screening colonoscopy is recommended. Jerene Bears, MD 12/17/2016 3:19:31 PM This report has been signed electronically.

## 2016-12-18 ENCOUNTER — Telehealth: Payer: Self-pay

## 2016-12-18 NOTE — Telephone Encounter (Signed)
Attempted to reach patient for post-procedure f/u call. No answer. Left message that we will attempt to reach her again on Monday and for her to please not hesitate to call us if she has any questions/concerns regarding her care.

## 2016-12-21 ENCOUNTER — Telehealth: Payer: Self-pay | Admitting: *Deleted

## 2016-12-21 NOTE — Telephone Encounter (Signed)
  Follow up Call-  Call back number 12/17/2016  Post procedure Call Back phone  # [phone]  Permission to leave phone message Yes  Some recent data might be hidden     Patient questions:  Do you have a fever, pain , or abdominal swelling? No. Pain Score  0 *  Have you tolerated food without any problems? Yes.    Have you been able to return to your normal activities? Yes.    Do you have any questions about your discharge instructions: Diet   No. Medications  No. Follow up visit  No.  Do you have questions or concerns about your Care? No.  Actions: * If pain score is 4 or above: No action needed, pain <4.

## 2016-12-22 ENCOUNTER — Encounter: Payer: Self-pay | Admitting: Internal Medicine

## 2016-12-24 ENCOUNTER — Telehealth: Payer: Self-pay | Admitting: Internal Medicine

## 2016-12-24 NOTE — Telephone Encounter (Signed)
Dr. Hilarie Fredrickson and Chong Sicilian, I was working on [employer] this afternoon for Lelan Pons, and the order to have a repeat EGD in 3 weeks was handwritten by the RR nurse.  Dr. Hilarie Fredrickson,  You do not have any 60 minute openings until 02-01-17.  Do you want to schedule a double for that time?  Thanks, [employer]

## 2016-12-24 NOTE — Telephone Encounter (Signed)
Dr Hilarie Fredrickson the pt's husband is stating that you wanted the pt to have repeat endo and possible colon in 3 weeks, however, I see no mention of this in the chart.  Please advise

## 2016-12-27 NOTE — Telephone Encounter (Signed)
Ok to schedule in the next available double procedure block Thanks JMP

## 2016-12-28 NOTE — Telephone Encounter (Signed)
Spoke with pt- ECL made for 02-04-17 at 3:00 p.m. and PV made for 01-21-17 at 11:00 a.m.

## 2017-01-21 ENCOUNTER — Other Ambulatory Visit: Payer: Self-pay

## 2017-01-21 ENCOUNTER — Ambulatory Visit (AMBULATORY_SURGERY_CENTER): Payer: Self-pay

## 2017-01-21 VITALS — Ht 63.0 in | Wt 181.2 lb

## 2017-01-21 DIAGNOSIS — Z1211 Encounter for screening for malignant neoplasm of colon: Secondary | ICD-10-CM

## 2017-01-21 DIAGNOSIS — R1310 Dysphagia, unspecified: Secondary | ICD-10-CM

## 2017-01-21 MED ORDER — NA SULFATE-K SULFATE-MG SULF 17.5-3.13-1.6 GM/177ML PO SOLN: 1.0000 | Freq: Once | ORAL | 0 refills | Status: AC

## 2017-01-21 NOTE — Progress Notes (Signed)
Denies allergies to eggs or soy products. Denies complication of anesthesia or sedation. Denies use of weight loss medication. Denies use of O2.   Emmi instructions declined.  

## 2017-02-02 ENCOUNTER — Telehealth: Payer: Self-pay | Admitting: Internal Medicine

## 2017-02-02 NOTE — Telephone Encounter (Signed)
Returned patients call regarding eating a small amount of nuts and a small amount of cole slaw on a hot dog. Patient was instructed to not eat any more of those foods, drink plenty of fluids and we will continue with scheduled procedure on Thursday 02/04/17 as planned.   Riki Sheer, LPN  ( PV )

## 2017-02-04 ENCOUNTER — Encounter: Payer: Self-pay | Admitting: Internal Medicine

## 2017-02-04 ENCOUNTER — Ambulatory Visit (AMBULATORY_SURGERY_CENTER): Payer: BLUE CROSS/BLUE SHIELD | Admitting: Internal Medicine

## 2017-02-04 VITALS — BP 150/84 | HR 92 | Temp 98.9°F | Resp 16 | Ht 63.0 in | Wt 181.0 lb

## 2017-02-04 DIAGNOSIS — Z1211 Encounter for screening for malignant neoplasm of colon: Secondary | ICD-10-CM

## 2017-02-04 DIAGNOSIS — D123 Benign neoplasm of transverse colon: Secondary | ICD-10-CM | POA: Diagnosis not present

## 2017-02-04 DIAGNOSIS — K222 Esophageal obstruction: Secondary | ICD-10-CM

## 2017-02-04 DIAGNOSIS — R1310 Dysphagia, unspecified: Secondary | ICD-10-CM | POA: Diagnosis not present

## 2017-02-04 DIAGNOSIS — R1319 Other dysphagia: Secondary | ICD-10-CM

## 2017-02-04 MED ORDER — SODIUM CHLORIDE 0.9 % IV SOLN: 500.0000 mL | INTRAVENOUS | Status: DC

## 2017-02-04 NOTE — Op Note (Signed)
[place] Patient Name: Lindsey Conley Procedure Date: 02/04/2017 3:05 PM MRN: [id number] Endoscopist: Jerene Bears , MD Age: 56 Referring MD:  Date of Birth: 05-27-1960 Gender: Female Account #: [id number] Procedure:                Upper GI endoscopy Indications:              Dysphagia, For therapy of esophageal stricture,                            last EGD 12/17/2016 (see report for details) Medicines:                Monitored Anesthesia Care Procedure:                Pre-Anesthesia Assessment:                           - Prior to the procedure, a History and Physical                            was performed, and patient medications and                            allergies were reviewed. The patient's tolerance of                            previous anesthesia was also reviewed. The risks                            and benefits of the procedure and the sedation                            options and risks were discussed with the patient.                            All questions were answered, and informed consent                            was obtained. Prior Anticoagulants: The patient has                            taken no previous anticoagulant or antiplatelet                            agents. ASA Grade Assessment: II - A patient with                            mild systemic disease. After reviewing the risks                            and benefits, the patient was deemed in                            satisfactory condition to undergo the procedure.  After obtaining informed consent, the endoscope was                            passed under direct vision. Throughout the                            procedure, the patient's blood pressure, pulse, and                            oxygen saturations were monitored continuously. The                            Endoscope was introduced through the mouth, and                            advanced to the  second part of duodenum. The upper                            GI endoscopy was accomplished without difficulty.                            The patient tolerated the procedure well. Scope In: Scope Out: Findings:                 One moderate (circumferential scarring or stenosis;                            an endoscope may pass) benign-appearing, intrinsic                            stenosis was found 37 to 38 cm from the incisors.                            This measured less than one cm (in length) and was                            traversed. A TTS dilator was passed through the                            scope. Dilation with a 13.5-14.5-15.5 mm balloon                            dilator was performed to 14.5 mm. The dilation site                            was examined and showed moderate improvement in                            luminal narrowing. Estimated blood loss was minimal.                           The exam of the esophagus was otherwise normal.  A 3 cm hiatal hernia was present.                           The entire examined stomach was normal.                           The examined duodenum was normal. Complications:            No immediate complications. Estimated Blood Loss:     Estimated blood loss was minimal. Impression:               - Benign-appearing esophageal stenosis. Dilated to                            14.5 cm.                           - 3 cm hiatal hernia.                           - Normal stomach.                           - Normal examined duodenum.                           - No specimens collected. Recommendation:           - Patient has a contact number available for                            emergencies. The signs and symptoms of potential                            delayed complications were discussed with the                            patient. Return to normal activities tomorrow.                            Written  discharge instructions were provided to the                            patient.                           - Post-dilation diet today, then resume previous                            diet tomorrow.                           - Continue present medications.                           - Repeat upper endoscopy as needed for retreatment. Jerene Bears, MD 02/04/2017 3:52:06 PM This report has been signed electronically.

## 2017-02-04 NOTE — Progress Notes (Signed)
Called to room to assist during endoscopic procedure.  Patient ID and intended procedure confirmed with present staff. Received instructions for my participation in the procedure from the performing physician.  

## 2017-02-04 NOTE — Op Note (Signed)
[facility] Patient Name: Lindsey Conley Procedure Date: 02/04/2017 3:05 PM MRN: [id number] Endoscopist: Jerene Bears , MD Age: 56 Referring MD:  Date of Birth: 02-20-1961 Gender: Female Account #: [id number] Procedure:                Colonoscopy Indications:              Screening for colorectal malignant neoplasm, This                            is the patient's first colonoscopy Medicines:                Monitored Anesthesia Care Procedure:                Pre-Anesthesia Assessment:                           - Prior to the procedure, a History and Physical                            was performed, and patient medications and                            allergies were reviewed. The patient's tolerance of                            previous anesthesia was also reviewed. The risks                            and benefits of the procedure and the sedation                            options and risks were discussed with the patient.                            All questions were answered, and informed consent                            was obtained. Prior Anticoagulants: The patient has                            taken no previous anticoagulant or antiplatelet                            agents. ASA Grade Assessment: II - A patient with                            mild systemic disease. After reviewing the risks                            and benefits, the patient was deemed in                            satisfactory condition to undergo the procedure.  After obtaining informed consent, the colonoscope                            was passed under direct vision. Throughout the                            procedure, the patient's blood pressure, pulse, and                            oxygen saturations were monitored continuously. The                            Colonoscope was introduced through the anus and                            advanced to the the cecum,  identified by                            appendiceal orifice and ileocecal valve. The                            colonoscopy was performed without difficulty. The                            patient tolerated the procedure well. The quality                            of the bowel preparation was good. The ileocecal                            valve, appendiceal orifice, and rectum were                            photographed. Scope In: 3:33:08 PM Scope Out: 3:43:31 PM Scope Withdrawal Time: 0 hours 8 minutes 25 seconds  Total Procedure Duration: 0 hours 10 minutes 23 seconds  Findings:                 The digital rectal exam was normal.                           A 4 mm polyp was found in the transverse colon. The                            polyp was sessile. The polyp was removed with a                            cold snare. Resection and retrieval were complete.                           The exam was otherwise without abnormality on                            direct and retroflexion views. Complications:  No immediate complications. Estimated Blood Loss:     Estimated blood loss was minimal. Impression:               - One 4 mm polyp in the transverse colon, removed                            with a cold snare. Resected and retrieved.                           - The examination was otherwise normal on direct                            and retroflexion views. Recommendation:           - Patient has a contact number available for                            emergencies. The signs and symptoms of potential                            delayed complications were discussed with the                            patient. Return to normal activities tomorrow.                            Written discharge instructions were provided to the                            patient.                           - Resume previous diet.                           - Continue present medications.                            - Await pathology results.                           - Repeat colonoscopy is recommended. The                            colonoscopy date will be determined after pathology                            results from today's exam become available for                            review. Jerene Bears, MD 02/04/2017 3:54:12 PM This report has been signed electronically.

## 2017-02-04 NOTE — Progress Notes (Signed)
Report given to PACU, vss 

## 2017-02-04 NOTE — Patient Instructions (Signed)
YOU HAD AN ENDOSCOPIC PROCEDURE TODAY AT [facility]:   Refer to the procedure report that was given to you for any specific questions about what was found during the examination.  If the procedure report does not answer your questions, please call your gastroenterologist to clarify.  If you requested that your care partner not be given the details of your procedure findings, then the procedure report has been included in a sealed envelope for you to review at your convenience later.  YOU SHOULD EXPECT: Some feelings of bloating in the abdomen. Passage of more gas than usual.  Walking can help get rid of the air that was put into your GI tract during the procedure and reduce the bloating. If you had a lower endoscopy (such as a colonoscopy or flexible sigmoidoscopy) you may notice spotting of blood in your stool or on the toilet paper. If you underwent a bowel prep for your procedure, you may not have a normal bowel movement for a few days.  Please Note:  You might notice some irritation and congestion in your nose or some drainage.  This is from the oxygen used during your procedure.  There is no need for concern and it should clear up in a day or so.  SYMPTOMS TO REPORT IMMEDIATELY:   Following lower endoscopy (colonoscopy or flexible sigmoidoscopy):  Excessive amounts of blood in the stool  Significant tenderness or worsening of abdominal pains  Swelling of the abdomen that is new, acute  Fever of 100F or higher   Following upper endoscopy (EGD)  Vomiting of blood or coffee ground material  New chest pain or pain under the shoulder blades  Painful or persistently difficult swallowing  New shortness of breath  Fever of 100F or higher  Black, tarry-looking stools  For urgent or emergent issues, a gastroenterologist can be reached at any hour by calling [phone].  Please read all handouts given to you by your recovery nurse.   DIET:  Please see post dilation  diet. Clear liquids to start at 445pm for 1 hour, then start soft diet for the rest of the day starting at 545pm.  In the morning you may proceed to your regular diet.  Drink plenty of fluids but you should avoid alcoholic beverages for 24 hours.  ACTIVITY:  You should plan to take it easy for the rest of today and you should NOT DRIVE or use heavy machinery until tomorrow (because of the sedation medicines used during the test).    FOLLOW UP: Our staff will call the number listed on your records the next business day following your procedure to check on you and address any questions or concerns that you may have regarding the information given to you following your procedure. If we do not reach you, we will leave a message.  However, if you are feeling well and you are not experiencing any problems, there is no need to return our call.  We will assume that you have returned to your regular daily activities without incident.  If any biopsies were taken you will be contacted by phone or by letter within the next 1-3 weeks.  Please call us at [phone] if you have not heard about the biopsies in 3 weeks.    SIGNATURES/CONFIDENTIALITY: You and/or your care partner have signed paperwork which will be entered into your electronic medical record.  These signatures attest to the fact that that the information above on your After Visit Summary has  been reviewed and is understood.  Full responsibility of the confidentiality of this discharge information lies with you and/or your care-partner.  Thank you for letting us take care of your healthcare needs today.

## 2017-02-05 ENCOUNTER — Telehealth: Payer: Self-pay | Admitting: *Deleted

## 2017-02-05 NOTE — Telephone Encounter (Signed)
  Follow up Call-  Call back number 02/04/2017 12/17/2016  Post procedure Call Back phone  # [phone]  Permission to leave phone message Yes Yes  Some recent data might be hidden     Patient questions:  Message left to call us if necessary.

## 2017-02-05 NOTE — Telephone Encounter (Signed)
  Follow up Call-  Call back number 02/04/2017 12/17/2016  Post procedure Call Back phone  # [phone]  Permission to leave phone message Yes Yes  Some recent data might be hidden     Patient questions:  Do you have a fever, pain , or abdominal swelling? No. Pain Score  0 *  Have you tolerated food without any problems? Yes.    Have you been able to return to your normal activities? yes  Do you have any questions about your discharge instructions: Diet   No. Medications  No. Follow up visit  No.  Do you have questions or concerns about your Care? No.  Actions: * If pain score is 4 or above: No action needed, pain <4.

## 2017-02-10 ENCOUNTER — Encounter: Payer: Self-pay | Admitting: Internal Medicine

## 2017-02-24 ENCOUNTER — Telehealth: Payer: Self-pay | Admitting: Internal Medicine

## 2017-02-24 NOTE — Telephone Encounter (Signed)
Pt had questions about her ultrasound she had done. Pt wanted to know what exactly she needed to do for fatty liver. Discussed with her that a low fat diet, weight loss and exercise are what is recommended for fatty liver. Pt verbalized understanding.

## 2017-10-27 ENCOUNTER — Encounter: Payer: Self-pay | Admitting: Cardiology

## 2017-10-27 ENCOUNTER — Other Ambulatory Visit: Payer: Self-pay | Admitting: Cardiology

## 2017-10-27 ENCOUNTER — Ambulatory Visit (INDEPENDENT_AMBULATORY_CARE_PROVIDER_SITE_OTHER): Payer: BLUE CROSS/BLUE SHIELD | Admitting: Cardiology

## 2017-10-27 VITALS — BP 150/100 | HR 84 | Ht 63.0 in | Wt 185.0 lb

## 2017-10-27 DIAGNOSIS — E785 Hyperlipidemia, unspecified: Secondary | ICD-10-CM | POA: Diagnosis not present

## 2017-10-27 DIAGNOSIS — I10 Essential (primary) hypertension: Secondary | ICD-10-CM

## 2017-10-27 DIAGNOSIS — R5383 Other fatigue: Secondary | ICD-10-CM | POA: Diagnosis not present

## 2017-10-27 DIAGNOSIS — R0609 Other forms of dyspnea: Secondary | ICD-10-CM | POA: Diagnosis not present

## 2017-10-27 DIAGNOSIS — I6523 Occlusion and stenosis of bilateral carotid arteries: Secondary | ICD-10-CM

## 2017-10-27 MED ORDER — LOSARTAN POTASSIUM 25 MG PO TABS: 25.0000 mg | ORAL_TABLET | Freq: Every day | ORAL | 3 refills | Status: DC

## 2017-10-27 MED ORDER — METOPROLOL TARTRATE 50 MG PO TABS: 50.0000 mg | ORAL_TABLET | Freq: Once | ORAL | 0 refills | Status: DC

## 2017-10-27 NOTE — Progress Notes (Signed)
Cardiology Office Note    Date:  1/61/0960  ID:  Lindsey Conley, DOB [birth year], MRN [id number] PCP:  System, Pcp Not In  Cardiologist:  Dr. Meda Coffee   Chief Complaint: routine check-up for palpitations  History of Present Illness:  Lindsey Conley is a 57 y.o. female with history of anemia, anxiety, HTN, migraines, esophageal dilation, obesity, mild AI by echo 2015, palpitation. She was remotely followed in our clinic for palpitations with prior normal workup. Remote holter reportedly normal. Echo in 2008 was unremarkable. OV in 2012 with Dr. Haroldine Laws raised question of symptoms r/t panic attacks. F/u echo 10/2013 showed EF 60-65%, mild AI. ETT 10/2013 showed poor exercise tolerance but no evidence of ischemia (total exercise time 21min) - walking program recommended. Last labs 2015 showed normal thyroid, CBC, CMET, LDL 103, HDL 55, trig 67.   She is coming after 1 year, she continues to feel SOB, tired, normal labs, improved palpitations, mild occassional dizziness. No falls. No LE edema, orthopnea, PND.  Past Medical History:  Diagnosis Date  . Anemia   . Anxiety   . Dyspnea    a. ETT 2015: poor exercise tolerance but nonischemic.  . Gallstone   . GERD (gastroesophageal reflux disease)   . History of colonoscopy   . HTN (hypertension)   . Migraine   . Mild AI (aortic insufficiency)    a. by echo 2015.  [name] Palpitations    echo and Holter in 2009 were normal  . Status post dilation of esophageal narrowing     Past Surgical History:  Procedure Laterality Date  . DILATION AND CURETTAGE OF UTERUS    . HYSTEROSCOPY      Current Medications: Current Meds  Medication Sig  . ALPRAZolam (XANAX) 0.25 MG tablet Take 0.25 mg by mouth at bedtime as needed.    . Cholecalciferol (VITAMIN D3) 50000 units CAPS Take 5,000 Units by mouth 1 day or 1 dose.  . ibuprofen (ADVIL,MOTRIN) 100 MG chewable tablet Chew 100 mg by mouth every 8 (eight) hours as needed.    . Melatonin 1 MG SUBL Place 2 mg  under the tongue at bedtime as needed.   Current Facility-Administered Medications for the 10/27/17 encounter (Office Visit) with Dorothy Spark, MD  Medication  . 0.9 %  sodium chloride infusion     Allergies:   Phenergan [promethazine hcl]; Promethazine hcl; and Penicillins   Social History   Socioeconomic History  . Marital status: Married    Spouse name: Not on file  . Number of children: 5  . Years of education: Not on file  . Highest education level: Not on file  Occupational History  . Occupation: care giver  Social Needs  . Financial resource strain: Not on file  . Food insecurity:    Worry: Not on file    Inability: Not on file  . Transportation needs:    Medical: Not on file    Non-medical: Not on file  Tobacco Use  . Smoking status: Never Smoker  . Smokeless tobacco: Never Used  Substance and Sexual Activity  . Alcohol use: Yes    Alcohol/week: 0.0 standard drinks    Comment: wine occasional  . Drug use: No  . Sexual activity: Not on file  Lifestyle  . Physical activity:    Days per week: Not on file    Minutes per session: Not on file  . Stress: Not on file  Relationships  . Social connections:    Talks on phone: Not  on file    Gets together: Not on file    Attends religious service: Not on file    Active member of club or organization: Not on file    Attends meetings of clubs or organizations: Not on file    Relationship status: Not on file  Other Topics Concern  . Not on file  Social History Narrative  . Not on file     Family History:  Family History  Problem Relation Age of Onset  . Asthma Mother 68  . Other Mother        palpitations  . Hypertension Father 68  . Prostate cancer Father   . Other Sister 42       platelet disorder  . Colon cancer Neg Hx   . Esophageal cancer Neg Hx   . Pancreatic cancer Neg Hx   . Stomach cancer Neg Hx   . Rectal cancer Neg Hx     ROS:   Please see the history of present illness.  All other  systems are reviewed and otherwise negative.   PHYSICAL EXAM:   VS:  BP (!) 150/100   Pulse 84   Ht 5\' 3"  (1.6 m)   Wt 185 lb (83.9 kg)   LMP 12/21/2013   SpO2 96%   BMI 32.77 kg/m   BMI: Body mass index is 32.77 kg/m. GEN: Well nourished, well developed obese WF in no acute distress  HEENT: normocephalic, atraumatic Neck: no JVD, carotid bruits, or masses Cardiac: RRR; no murmurs, rubs, or gallops, no edema  Respiratory:  clear to auscultation bilaterally, normal work of breathing GI: soft, nontender, nondistended, + BS MS: no deformity or atrophy  Skin: warm and dry, no rash Neuro:  Alert and Oriented x 3, Strength and sensation are intact, follows commands Psych: euthymic mood, full affect  Wt Readings from Last 3 Encounters:  10/27/17 185 lb (83.9 kg)  02/04/17 181 lb (82.1 kg)  01/21/17 181 lb 3.2 oz (82.2 kg)    Studies/Labs Reviewed:   EKG:  EKG was ordered today and personally reviewed by me and demonstrates NSR 75bpm, no acute changes. Normal intervals.  Recent Labs: 11/03/2016: ALT 21   Lipid Panel    Component Value Date/Time   CHOL 172 12/27/2013 0958   TRIG 67.0 12/27/2013 0958   HDL 55.60 12/27/2013 0958   CHOLHDL 3 12/27/2013 0958   VLDL 13.4 12/27/2013 0958   LDLCALC 103 (H) 12/27/2013 0958   Additional studies/ records that were reviewed today include: TTE:11/2016 - Left ventricle: The cavity size was normal. Wall thickness was   normal. Systolic function was normal. The estimated ejection   fraction was in the range of 55% to 60%. Wall motion was normal;   there were no regional wall motion abnormalities. Doppler   parameters are consistent with abnormal left ventricular   relaxation (grade 1 diastolic dysfunction). - Aortic valve: There was mild regurgitation. - Tricuspid valve: There was trivial regurgitation.  Impressions:  - Compared to the prior study, there has been no significant   interval change.  EKG performed today October 27, 2017 shows normal sinus rhythm, normal EKG unchanged from prior, this was personally reviewed.  ASSESSMENT & PLAN:   1. Dyspnea - with elevated lipids, I will obtain coronary CTA,  2. Palpitations - improved. 3. Mild AI - stable on echo in 2018 4. If her stress test normal, she is strongly encouraged to start exercising on a regular basis.  5. Hypertension -new, I will  start losartan 25 mg daily, this will also help with afterload with aortic regurgitation.    Disposition: F/u with Dr. Meda Coffee in 4 months.   Medication Adjustments/Labs and Tests Ordered: Current medicines are reviewed at length with the patient today.  Concerns regarding medicines are outlined above. Medication changes, Labs and Tests ordered today are summarized above and listed in the Patient Instructions accessible in Encounters.   Signed, Ena Dawley, MD  10/27/2017 10:04 AM    [address], [place], [place]  [address] Phone: [phone]; Fax: [phone]

## 2017-10-27 NOTE — Patient Instructions (Signed)
Medication Instructions:   START TAKING LOSARTAN 25 MG ONCE DAILY    Labwork:  BMET---PRIOR TO YOUR CORONARY CTA PER CT PROTOCOL     Testing/Procedures:  Your physician has requested that you have a carotid duplex. This test is an ultrasound of the carotid arteries in your neck. It looks at blood flow through these arteries that supply the brain with blood. Allow one hour for this exam. There are no restrictions or special instructions.    CORONARY CT Please arrive at the [facility] main entrance of [facility] at xx:xx AM (30-45 minutes prior to test start time)  [facility] [place], [place] [address] [phone]  Proceed to the [facility] Radiology Department (First Floor).  Please follow these instructions carefully (unless otherwise directed):    On the Night Before the Test: . Drink plenty of water. . Do not consume any caffeinated/decaffeinated beverages or chocolate 12 hours prior to your test. . Do not take any antihistamines 12 hours prior to your test.  On the Day of the Test: . Drink plenty of water. Do not drink any water within one hour of the test. . Do not eat any food 4 hours prior to the test. . You may take your regular medications prior to the test. . IF NOT ON A BETA BLOCKER - Take 50 mg of lopressor (metoprolol) one hour before the test.   After the Test: . Drink plenty of water. . After receiving IV contrast, you may experience a mild flushed feeling. This is normal. . On occasion, you may experience a mild rash up to 24 hours after the test. This is not dangerous. If this occurs, you can take Benadryl 25 mg and increase your fluid intake. . If you experience trouble breathing, this can be serious. If it is severe call 911 IMMEDIATELY. If it is mild, please call our office.       Follow-Up:  4-5 MONTHS WITH DR Meda Coffee       If you need a refill on your cardiac medications before your next  appointment, please call your pharmacy.

## 2017-11-09 ENCOUNTER — Ambulatory Visit (HOSPITAL_COMMUNITY)
Admission: RE | Admit: 2017-11-09 | Discharge: 2017-11-09 | Disposition: A | Discharge status: Home / Self Care | Payer: BLUE CROSS/BLUE SHIELD | Source: Ambulatory Visit | Attending: Cardiology | Admitting: Cardiology

## 2017-11-09 DIAGNOSIS — I6523 Occlusion and stenosis of bilateral carotid arteries: Secondary | ICD-10-CM | POA: Diagnosis not present

## 2017-11-11 ENCOUNTER — Telehealth: Payer: Self-pay | Admitting: *Deleted

## 2017-11-11 DIAGNOSIS — E041 Nontoxic single thyroid nodule: Secondary | ICD-10-CM

## 2017-11-11 DIAGNOSIS — E042 Nontoxic multinodular goiter: Secondary | ICD-10-CM

## 2017-11-11 NOTE — Telephone Encounter (Signed)
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 I would recommend a dedicated thyroid ultrasound.

## 2017-11-11 NOTE — Telephone Encounter (Signed)
Notified the pt that per Dr Meda Coffee, her carotid US showed no stenosis, but noted was multiple avascular subcentimeter nodules noted in the right lobe of the thyroid, so she recommends that we order for her to have a thyroid US done.  Informed the pt that I will place the thyroid US in the system, and send a message to our [facility] schedulers to call her back to arrange this appt.  Pt verbalized understanding and agrees with this plan.

## 2017-11-25 ENCOUNTER — Ambulatory Visit (HOSPITAL_COMMUNITY): Payer: BLUE CROSS/BLUE SHIELD

## 2017-11-25 ENCOUNTER — Ambulatory Visit (HOSPITAL_COMMUNITY)
Admission: RE | Admit: 2017-11-25 | Discharge: 2017-11-25 | Disposition: A | Discharge status: Home / Self Care | Payer: BLUE CROSS/BLUE SHIELD | Source: Ambulatory Visit | Attending: Cardiology | Admitting: Cardiology

## 2017-11-25 ENCOUNTER — Encounter (HOSPITAL_COMMUNITY): Payer: Self-pay

## 2017-11-25 DIAGNOSIS — R0609 Other forms of dyspnea: Secondary | ICD-10-CM

## 2017-11-25 DIAGNOSIS — E042 Nontoxic multinodular goiter: Secondary | ICD-10-CM | POA: Diagnosis not present

## 2017-11-25 DIAGNOSIS — R5383 Other fatigue: Secondary | ICD-10-CM

## 2017-11-25 DIAGNOSIS — E785 Hyperlipidemia, unspecified: Secondary | ICD-10-CM | POA: Diagnosis not present

## 2017-11-25 DIAGNOSIS — E041 Nontoxic single thyroid nodule: Secondary | ICD-10-CM | POA: Diagnosis present

## 2017-11-25 MED ORDER — METOPROLOL TARTRATE 5 MG/5ML IV SOLN
5.0000 mg | INTRAVENOUS | Status: DC | PRN
  Administered 2017-11-25 (×4): 5 mg via INTRAVENOUS
  Filled 2017-11-25: qty 5

## 2017-11-25 MED ORDER — NITROGLYCERIN 0.4 MG SL SUBL
0.8000 mg | SUBLINGUAL_TABLET | Freq: Once | SUBLINGUAL | Status: AC
  Administered 2017-11-25: 0.8 mg via SUBLINGUAL
  Filled 2017-11-25: qty 25

## 2017-11-25 MED ORDER — NITROGLYCERIN 0.4 MG SL SUBL
SUBLINGUAL_TABLET | SUBLINGUAL | Status: AC
  Administered 2017-11-25: 0.8 mg via SUBLINGUAL
  Filled 2017-11-25: qty 2

## 2017-11-25 MED ORDER — IOPAMIDOL (ISOVUE-370) INJECTION 76%
INTRAVENOUS | Status: AC
  Administered 2017-11-25: 100 mL
  Filled 2017-11-25: qty 100

## 2017-11-25 MED ORDER — METOPROLOL TARTRATE 5 MG/5ML IV SOLN
INTRAVENOUS | Status: AC
  Administered 2017-11-25: 5 mg via INTRAVENOUS
  Filled 2017-11-25: qty 20

## 2017-11-29 ENCOUNTER — Telehealth: Payer: Self-pay | Admitting: *Deleted

## 2017-11-29 DIAGNOSIS — E041 Nontoxic single thyroid nodule: Secondary | ICD-10-CM

## 2017-11-29 DIAGNOSIS — E042 Nontoxic multinodular goiter: Secondary | ICD-10-CM

## 2017-11-29 NOTE — Telephone Encounter (Signed)
Left a message for the pt to call back to endorse thyroid US results and recommendations per Dr Meda Coffee.

## 2017-11-29 NOTE — Telephone Encounter (Signed)
-----   Message from Dorothy Spark, MD sent at 11/29/2017 12:23 PM EDT ----- Thyroid ultrasound showed multiple nodules within her thyroid gland, please refer her to Dr. Armandina Gemma for further evaluation/biopsy.  Thank you, [name]

## 2017-11-29 NOTE — Telephone Encounter (Signed)
Notified the pt that per Dr Meda Coffee, her thyroid US showed multiple nodules within her thyroid gland, and she recommends that we refer her to Dr. Armandina Gemma for further evaluation/biopsy.  Informed the pt that I have already placed the referral in the system to see Dr Harlow Asa, and our schedulers and Dr Gala Lewandowsky office will be in contact with her shortly, to arrange this appt.  Pt verbalized understanding and agrees with this plan.

## 2017-11-29 NOTE — Telephone Encounter (Signed)
AMB REFERRAL TO GENERAL SURGERY  Received: Today  Message Contents  Price, Mariel Kansky, LPN        Referral faxed to [facility]

## 2017-12-01 ENCOUNTER — Telehealth: Payer: Self-pay | Admitting: *Deleted

## 2017-12-01 NOTE — Telephone Encounter (Signed)
AMB REFERRAL TO GENERAL SURGERY  Received: Today  Message Contents  Jerlyn Ly, LPN        Patient is aware 12-09-17 @ 1:30

## 2017-12-13 ENCOUNTER — Other Ambulatory Visit: Payer: Self-pay | Admitting: Surgery

## 2017-12-13 DIAGNOSIS — E041 Nontoxic single thyroid nodule: Secondary | ICD-10-CM

## 2017-12-23 ENCOUNTER — Other Ambulatory Visit (HOSPITAL_COMMUNITY)
Admission: RE | Admit: 2017-12-23 | Discharge: 2017-12-23 | Disposition: A | Discharge status: Home / Self Care | Payer: BLUE CROSS/BLUE SHIELD | Source: Ambulatory Visit | Attending: Physician Assistant | Admitting: Physician Assistant

## 2017-12-23 ENCOUNTER — Ambulatory Visit
Admission: RE | Admit: 2017-12-23 | Discharge: 2017-12-23 | Disposition: A | Discharge status: Home / Self Care | Payer: BLUE CROSS/BLUE SHIELD | Source: Ambulatory Visit | Attending: Surgery | Admitting: Surgery

## 2017-12-23 DIAGNOSIS — E041 Nontoxic single thyroid nodule: Secondary | ICD-10-CM | POA: Diagnosis present

## 2017-12-23 NOTE — Procedures (Signed)
PROCEDURE SUMMARY:  Using direct ultrasound guidance, 5 passes were made using 25 g needles into the nodule within the left mid lobe of the thyroid.   Using direct ultrasound guidance, 5 passes were made using 25 g needles into the nodule within the left inferior lobe of the thyroid.   Ultrasound was used to confirm needle placements on all occasions.   Specimens were sent to Pathology for analysis.  See procedure note under Imaging tab in Epic for full procedure details.  Nyrah Demos S Jazari Ober PA-C 12/23/2017 3:40 PM

## 2018-01-10 NOTE — Telephone Encounter (Signed)
Patient contacted. No additional notes recorded.

## 2018-01-18 ENCOUNTER — Encounter (HOSPITAL_COMMUNITY): Payer: Self-pay

## 2018-01-21 ENCOUNTER — Other Ambulatory Visit: Payer: Self-pay

## 2018-01-21 ENCOUNTER — Telehealth: Payer: Self-pay | Admitting: Vascular Surgery

## 2018-01-21 DIAGNOSIS — I83893 Varicose veins of bilateral lower extremities with other complications: Secondary | ICD-10-CM

## 2018-01-21 NOTE — Telephone Encounter (Signed)
-----   Message from Rolla Flatten, RN sent at 01/19/2018  2:12 PM EST ----- She needs bilat reflux study and new vv appt. Plz sched and call her [phone]

## 2018-01-21 NOTE — Telephone Encounter (Signed)
sch appt lvm mld 03/07/18 10am LE reflux 11am New vv pt

## 2018-01-21 NOTE — Telephone Encounter (Signed)
sch appt lvm mld ltr 03/07/18 11am LE Reflux 12pm MD

## 2018-01-21 NOTE — Telephone Encounter (Signed)
-----   Message from Rolla Flatten, RN sent at 01/19/2018  2:12 PM EST ----- She needs bilat reflux study and new vv appt. Plz sched and call her [phone]

## 2018-03-04 ENCOUNTER — Telehealth (HOSPITAL_COMMUNITY): Payer: Self-pay | Admitting: Surgery

## 2018-03-04 NOTE — Telephone Encounter (Signed)
Attempted to contact patient to confirm appointments for 03/07/2018 ACB 

## 2018-03-07 ENCOUNTER — Encounter (HOSPITAL_COMMUNITY): Payer: BLUE CROSS/BLUE SHIELD

## 2018-03-07 ENCOUNTER — Encounter: Payer: BLUE CROSS/BLUE SHIELD | Admitting: Vascular Surgery

## 2018-04-29 ENCOUNTER — Encounter (HOSPITAL_COMMUNITY): Payer: BLUE CROSS/BLUE SHIELD

## 2018-04-29 ENCOUNTER — Encounter: Payer: BLUE CROSS/BLUE SHIELD | Admitting: Vascular Surgery

## 2018-06-03 ENCOUNTER — Encounter (HOSPITAL_COMMUNITY): Payer: BLUE CROSS/BLUE SHIELD

## 2018-06-03 ENCOUNTER — Encounter: Payer: BLUE CROSS/BLUE SHIELD | Admitting: Vascular Surgery

## 2018-08-23 ENCOUNTER — Telehealth: Payer: Self-pay | Admitting: Internal Medicine

## 2018-08-23 NOTE — Telephone Encounter (Signed)
The pt states she was diagnosed with fatty liver and has had some abd pain.  She would like to make an appt with Dr Hilarie Fredrickson.  Appt made for 7/28.

## 2018-08-23 NOTE — Telephone Encounter (Signed)
Pt stated that she has a fatty liver and that she is experiencing abdominal pain.  Please advise.

## 2018-09-16 ENCOUNTER — Encounter (HOSPITAL_COMMUNITY): Payer: BLUE CROSS/BLUE SHIELD

## 2018-09-16 ENCOUNTER — Encounter: Payer: BLUE CROSS/BLUE SHIELD | Admitting: Vascular Surgery

## 2018-09-22 ENCOUNTER — Encounter: Payer: Self-pay | Admitting: *Deleted

## 2018-09-22 ENCOUNTER — Telehealth: Payer: Self-pay | Admitting: *Deleted

## 2018-09-22 NOTE — Telephone Encounter (Signed)
Virtual Visit Pre-Appointment Phone Call  "(Name), I am calling you today to discuss your upcoming appointment. We are currently trying to limit exposure to the virus that causes COVID-19 by seeing patients at home rather than in the office."  1. "What is the BEST phone number to call the day of the visit?" - include this in appointment notes-YES UPDATED  2. "Do you have or have access to (through a family member/friend) a smartphone with video capability that we can use for your visit?"  a. If no - list the appointment type as a PHONE visit in appointment notes-YES UPDATED  3. Confirm consent - "In the setting of the current Covid19 crisis, you are scheduled for a (phone ) visit with Dr. Meda Coffee on 7/23 at 0930.  Just as we do with many in-office visits, in order for you to participate in this visit, we must obtain consent.  If you'd like, I can send this to your mychart (if signed up) or email for you to review.  Otherwise, I can obtain your verbal consent now.  All virtual visits are billed to your insurance company just like a normal visit would be.  By agreeing to a virtual visit, we'd like you to understand that the technology does not allow for your provider to perform an examination, and thus may limit your provider's ability to fully assess your condition. If your provider identifies any concerns that need to be evaluated in person, we will make arrangements to do so.  Finally, though the technology is pretty good, we cannot assure that it will always work on either your or our end, and in the setting of a video visit, we may have to convert it to a phone-only visit.  In either situation, we cannot ensure that we have a secure connection.  Are you willing to proceed?" STAFF: Did the patient verbally acknowledge consent to telehealth visit? Document YES/NO here: YES PT GAVE VERBAL CONSENT TO TREAT AS WELL AS CONSENT SENT TO MYCHART TO REVIEW.  4. Advise patient to be prepared - "Two hours  prior to your appointment, go ahead and check your blood pressure, pulse, oxygen saturation, and your weight (if you have the equipment to check those) and write them all down. When your visit starts, your provider will ask you for this information. If you have an Apple Watch or Kardia device, please plan to have heart rate information ready on the day of your appointment. Please have a pen and paper handy nearby the day of the visit as well."-YES PT WILL OBTAIN   5. Inform patient they will receive a phone call 15 minutes prior to their appointment time (may be from unknown caller ID) so they should be prepared to [name] has been deemed a candidate for a follow-up tele-health visit to limit community exposure during the Covid-19 pandemic. I spoke with the patient via phone to ensure availability of phone/video source, confirm preferred email & phone number, and discuss instructions and expectations.  I reminded Lindsey Conley to be prepared with any vital sign and/or heart rhythm information that could potentially be obtained via home monitoring, at the time of her visit. I reminded Lindsey Conley to expect a phone call prior to her visit.  Lindsey Alpha, LPN 3/76/2831 5:17 PM       FULL LENGTH CONSENT FOR TELE-HEALTH VISIT   I hereby voluntarily request, consent and authorize [facility] and its employed  or contracted physicians, physician assistants, nurse practitioners or other licensed health care professionals (the Practitioner), to provide me with telemedicine health care services (the "Services") as deemed necessary by the treating Practitioner. I acknowledge and consent to receive the Services by the Practitioner via telemedicine. I understand that the telemedicine visit will involve communicating with the Practitioner through live audiovisual communication technology and the disclosure of certain medical information by electronic  transmission. I acknowledge that I have been given the opportunity to request an in-person assessment or other available alternative prior to the telemedicine visit and am voluntarily participating in the telemedicine visit.  I understand that I have the right to withhold or withdraw my consent to the use of telemedicine in the course of my care at any time, without affecting my right to future care or treatment, and that the Practitioner or I may terminate the telemedicine visit at any time. I understand that I have the right to inspect all information obtained and/or recorded in the course of the telemedicine visit and may receive copies of available information for a reasonable fee.  I understand that some of the potential risks of receiving the Services via telemedicine include:  [name] Delay or interruption in medical evaluation due to technological equipment failure or disruption; . Information transmitted may not be sufficient (e.g. poor resolution of images) to allow for appropriate medical decision making by the Practitioner; and/or  . In rare instances, security protocols could fail, causing a breach of personal health information.  Furthermore, I acknowledge that it is my responsibility to provide information about my medical history, conditions and care that is complete and accurate to the best of my ability. I acknowledge that Practitioner's advice, recommendations, and/or decision may be based on factors not within their control, such as incomplete or inaccurate data provided by me or distortions of diagnostic images or specimens that may result from electronic transmissions. I understand that the practice of medicine is not an exact science and that Practitioner makes no warranties or guarantees regarding treatment outcomes. I acknowledge that I will receive a copy of this consent concurrently upon execution via email to the email address I last provided but may also request a printed copy by  calling the office of [name].    I understand that my insurance will be billed for this visit.   I have read or had this consent read to me. . I understand the contents of this consent, which adequately explains the benefits and risks of the Services being provided via telemedicine.  . I have been provided ample opportunity to ask questions regarding this consent and the Services and have had my questions answered to my satisfaction. . I give my informed consent for the services to be provided through the use of telemedicine in my medical care  By participating in this telemedicine visit I agree to the above.-YES PT GAVE VERBAL CONSENT TO TREAT AS WELL AS CONSENT SENT TO MYCHART TO REVIEW FOR TELEPHONE VISIT WITH DR Meda Coffee ON 7/23 AT 0930.

## 2018-09-27 NOTE — Progress Notes (Signed)
Virtual Visit via Video Note   This visit type was conducted due to national recommendations for restrictions regarding the COVID-19 Pandemic (e.g. social distancing) in an effort to limit this patient's exposure and mitigate transmission in our community.  Due to her co-morbid illnesses, this patient is at least at moderate risk for complications without adequate follow up.  This format is felt to be most appropriate for this patient at this time.  All issues noted in this document were discussed and addressed.  A limited physical exam was performed with this format.  Please refer to the patient's chart for her consent to telehealth for [facility].   Date:  6/78/9381   ID:  Lindsey Conley, DOB [id number], MRN [id number]  Patient Location: Home Provider Location: Home  PCP:  System, Pcp Not In  Cardiologist:  Ena Dawley, MD  Electrophysiologist:  None   Evaluation Performed:  Follow-Up Visit, 1 year  Chief Complaint: Palpitations and weight gain  History of Present Illness:    Lindsey Conley is a 58 y.o. female with history of anemia, anxiety, HTN, migraines, esophageal dilation, obesity, mild AI by echo 2015, palpitation. She was remotely followed in our clinic for palpitations with prior normal workup. Remote holter reportedly normal. Echo in 2008 was unremarkable. OV in 2012 with Dr. Haroldine Laws raised question of symptoms r/t panic attacks. F/u echo 10/2013 showed EF 60-65%, mild AI. ETT 10/2013 showed poor exercise tolerance but no evidence of ischemia (total exercise time 52min) - walking program recommended. Last labs 2015 showed normal thyroid, CBC, CMET, LDL 103, HDL 55, trig 67.   10/27/2017 - she complained of SOB, fatigue, improved palpitations, she underwent coronary CTA that showed coronary calcium score of 0. This was 0 percentile for age and sex matched control. No evidence of CAD.  09/29/2018 - 1 year follow up, the patient is doing well, she stays at home and takes  care of her son.  She complains of a unexpected weight gain in the last year or 15 pounds.  She does not exercise.  She has noticed significant abdominal bloating and she feels like she has mass in a right lower quadrant, she had colonoscopy few years ago that was normal.  She recently had her blood work checked including TSH and it was normal.  The patient does have symptoms concerning for COVID-19 infection (fever, chills, cough, or new shortness of breath).  Past Medical History:  Diagnosis Date  . Anemia   . Anxiety   . Dyspnea    a. ETT 2015: poor exercise tolerance but nonischemic.  . Gallstone   . GERD (gastroesophageal reflux disease)   . History of colonoscopy   . HTN (hypertension)   . Migraine   . Mild AI (aortic insufficiency)    a. by echo 2015.  [name] Palpitations    echo and Holter in 2009 were normal  . Status post dilation of esophageal narrowing    Past Surgical History:  Procedure Laterality Date  . DILATION AND CURETTAGE OF UTERUS    . HYSTEROSCOPY      Current Meds  Medication Sig  . ALPRAZolam (XANAX) 0.25 MG tablet Take 0.25 mg by mouth at bedtime as needed.    . Cholecalciferol (VITAMIN D3) 50000 units CAPS Take 5,000 Units by mouth 1 day or 1 dose.  . ibuprofen (ADVIL,MOTRIN) 100 MG chewable tablet Chew 100 mg by mouth every 8 (eight) hours as needed.    . Melatonin 1 MG SUBL Place 2 mg under  the tongue at bedtime as needed.    Allergies:   Phenergan [promethazine hcl], Promethazine hcl, and Penicillins   Social History   Tobacco Use  . Smoking status: Never Smoker  . Smokeless tobacco: Never Used  Substance Use Topics  . Alcohol use: Yes    Alcohol/week: 0.0 standard drinks    Comment: wine occasional  . Drug use: No     Family Hx: The patient's family history includes Asthma (age of onset: 74) in her mother; Hypertension (age of onset: 61) in her father; Other in her mother; Other (age of onset: 31) in her sister; Prostate cancer in her father.  There is no history of Colon cancer, Esophageal cancer, Pancreatic cancer, Stomach cancer, or Rectal cancer.  ROS:  Please see the history of present illness.    All other systems reviewed and are negative.   Prior CV studies:   The following studies were reviewed today:  Coronary CTA 11/26/2018 1. Coronary calcium score of 0. This was 0 percentile for age and sex matched control. 2. Normal coronary origin with right dominance. 3. No evidence of CAD.  Labs/Other Tests and Data Reviewed:    EKG:  No ECG reviewed.  Recent Labs: No results found for requested labs within last 8760 hours.   Recent Lipid Panel Lab Results  Component Value Date/Time   CHOL 172 12/27/2013 09:58 AM   TRIG 67.0 12/27/2013 09:58 AM   HDL 55.60 12/27/2013 09:58 AM   CHOLHDL 3 12/27/2013 09:58 AM   LDLCALC 103 (H) 12/27/2013 09:58 AM   Wt Readings from Last 3 Encounters:  09/29/18 199 lb (90.3 kg)  10/27/17 185 lb (83.9 kg)  02/04/17 181 lb (82.1 kg)    Objective:    Vital Signs:  BP 130/75   Pulse 80   Wt 199 lb (90.3 kg)   LMP 12/21/2013   BMI 35.25 kg/m    VITAL SIGNS:  reviewed   ASSESSMENT & PLAN:    1. Dyspnea - no CAD on coronary CTA, she is advised to exercise 5 times a week for 30 minutes.  This will also help with her weight gain and fatty liver. 2. Palpitations -continues to have seconds lasting palpitations couple times a months, she is reassured, and advised to call us if they become longer or more frequent.. 3. Mild AI - stable on echo in 2018 4. Hypertension -controlled with start losartan 25 mg daily. 5.  Abdominal bloating, weight gain subjective feeling of mass in her abdomen, she is advised to follow with her primary care, and potentially arrange abdominal CT.   COVID-19 Education: The signs and symptoms of COVID-19 were discussed with the patient and how to seek care for testing (follow up with PCP or arrange E-visit).  The importance of social distancing was discussed  today.  Time:   Today, I have spent 15 minutes with the patient with telehealth technology discussing the above problems.     Medication Adjustments/Labs and Tests Ordered: Current medicines are reviewed at length with the patient today.  Concerns regarding medicines are outlined above.   Tests Ordered: No orders of the defined types were placed in this encounter.   Medication Changes: No orders of the defined types were placed in this encounter.   Follow Up:  Virtual Visit or In Person in 1 year(s)  Signed, Ena Dawley, MD  09/29/2018 9:36 AM    [place]

## 2018-09-29 ENCOUNTER — Other Ambulatory Visit: Payer: Self-pay

## 2018-09-29 ENCOUNTER — Telehealth (INDEPENDENT_AMBULATORY_CARE_PROVIDER_SITE_OTHER): Payer: BC Managed Care – PPO | Admitting: Cardiology

## 2018-09-29 ENCOUNTER — Encounter: Payer: Self-pay | Admitting: Cardiology

## 2018-09-29 ENCOUNTER — Encounter: Payer: Self-pay | Admitting: *Deleted

## 2018-09-29 VITALS — BP 130/75 | HR 80 | Wt 199.0 lb

## 2018-09-29 DIAGNOSIS — I10 Essential (primary) hypertension: Secondary | ICD-10-CM | POA: Diagnosis not present

## 2018-09-29 DIAGNOSIS — R0609 Other forms of dyspnea: Secondary | ICD-10-CM

## 2018-09-29 DIAGNOSIS — I351 Nonrheumatic aortic (valve) insufficiency: Secondary | ICD-10-CM

## 2018-09-29 DIAGNOSIS — R002 Palpitations: Secondary | ICD-10-CM

## 2018-09-29 DIAGNOSIS — R0600 Dyspnea, unspecified: Secondary | ICD-10-CM

## 2018-09-29 NOTE — Patient Instructions (Signed)
Medication Instructions:   Your physician recommends that you continue on your current medications as directed. Please refer to the Current Medication list given to you today.  If you need a refill on your cardiac medications before your next appointment, please call your pharmacy.       Follow-Up: At [facility], you and your health needs are our priority.  As part of our continuing mission to provide you with exceptional heart care, we have created designated Provider Care Teams.  These Care Teams include your primary Cardiologist (physician) and Advanced Practice Providers (APPs -  Physician Assistants and Nurse Practitioners) who all work together to provide you with the care you need, when you need it. You will need a follow up appointment in 6 months.  Please call our office 2 months in advance to schedule this appointment.  You may see Katarina Nelson, MD or one of the following Advanced Practice Providers on your designated Care Team:   Brittainy Simmons, PA-C Dayna Dunn, PA-C . Michele Lenze, PA-C     

## 2018-10-04 ENCOUNTER — Ambulatory Visit (INDEPENDENT_AMBULATORY_CARE_PROVIDER_SITE_OTHER): Payer: BC Managed Care – PPO | Admitting: Internal Medicine

## 2018-10-04 ENCOUNTER — Encounter: Payer: Self-pay | Admitting: Internal Medicine

## 2018-10-04 VITALS — Ht 62.0 in | Wt 198.0 lb

## 2018-10-04 DIAGNOSIS — K222 Esophageal obstruction: Secondary | ICD-10-CM | POA: Diagnosis not present

## 2018-10-04 DIAGNOSIS — R1319 Other dysphagia: Secondary | ICD-10-CM

## 2018-10-04 DIAGNOSIS — R1310 Dysphagia, unspecified: Secondary | ICD-10-CM | POA: Diagnosis not present

## 2018-10-04 DIAGNOSIS — K8020 Calculus of gallbladder without cholecystitis without obstruction: Secondary | ICD-10-CM

## 2018-10-04 DIAGNOSIS — K760 Fatty (change of) liver, not elsewhere classified: Secondary | ICD-10-CM

## 2018-10-04 DIAGNOSIS — R1011 Right upper quadrant pain: Secondary | ICD-10-CM | POA: Diagnosis not present

## 2018-10-05 NOTE — Progress Notes (Signed)
Patient ID: Lindsey Conley, female   DOB: 05/06/1960, 58 y.o.   MRN: [id number] HPI:   This service was provided via telemedicine.  Doximity application used for A/V communication; visual communication was spotty and lagging and thus we changed to audio for the remaining call. The patient was located at home The provider was located in provider's GI office. The patient did consent to this telephone visit and is aware of possible charges through their insurance for this visit.   The persons participating in this telemedicine service were the patient and I. Time spent on call: 22 minutes  Lindsey Conley is a 58 year old female with a past medical history of benign esophageal stricture status post dilation to 14.5 mm last in November 2018, 3 cm hiatal hernia, fatty liver, history of gallstones, hypertension who is seen in follow-up.  She is seen virtually today in the setting of COVID-19.  She was last seen in the office in August 2018 and for upper endoscopy in November 2018.  She reports that she has been having issues with right upper abdominal pain.  This is not all the time and thus it is intermittent.  It feels like a tightening or almost like somebody is "turning flips" or "kicking me from the inside".  Can last 30 minutes.  Always precipitated by eating.  Worse with heavy or fatty meal.  Walks around and rubs her right side to try to alleviate the discomfort.  Occasional nausea but no vomiting.  She feels abdominal bloating frequently.  She is also been gaining weight and she is frustrated by this.  She is having issues swallowing tablets again and this is causing a choking sensation.  She feels the tablets will dissolve in her esophagus and then burn her throat.  She has occasional heartburn but this is actually not very frequent.  No odynophagia.  She saw an herbalist who has placed her on several herbal medications under the brand [employer].  1 of these is a "liver cleanse capsule).  Bowel  movements have been mostly regular without significant diarrhea or constipation.  No blood in her stool or melena.  She does not drink alcohol.  She is worried about her liver, fatty liver and wants to be sure she does not have cirrhosis.  No jaundice.  No increasing abdominal girth or lower extremity edema but she does feel bloated as above  There is a reported history of elevated liver enzymes and she is had a prior abdominal ultrasound in 2018.  I reviewed this today.  See below.   Past Medical History:  Diagnosis Date  . Anemia   . Anxiety   . Dyspnea    a. ETT 2015: poor exercise tolerance but nonischemic.  . Gallstone   . GERD (gastroesophageal reflux disease)   . History of colonoscopy   . HTN (hypertension)   . Migraine   . Mild AI (aortic insufficiency)    a. by echo 2015.  [name] Palpitations    echo and Holter in 2009 were normal  . Status post dilation of esophageal narrowing     Past Surgical History:  Procedure Laterality Date  . DILATION AND CURETTAGE OF UTERUS    . HYSTEROSCOPY      Outpatient Medications Prior to Visit  Medication Sig Dispense Refill  . ALPRAZolam (XANAX) 0.25 MG tablet Take 0.25 mg by mouth at bedtime as needed.      . Cholecalciferol (DIALYVITE VITAMIN D 5000 PO) Take 1-2 tablets by mouth daily.    [name]  ibuprofen (ADVIL) 200 MG tablet Take 200 mg by mouth every 6 (six) hours as needed.    . Melatonin 1 MG SUBL Place 2 mg under the tongue at bedtime as needed.    . Cholecalciferol (VITAMIN D3) 50000 units CAPS Take 5,000 Units by mouth 1 day or 1 dose.    . ibuprofen (ADVIL,MOTRIN) 100 MG chewable tablet Chew 100 mg by mouth every 8 (eight) hours as needed.       No facility-administered medications prior to visit.     Allergies  Allergen Reactions  . Phenergan [Promethazine Hcl]     Rash  . Promethazine Hcl   . Penicillins Palpitations and Rash    Family History  Problem Relation Age of Onset  . Asthma Mother 56  . Other Mother         palpitations  . Hypertension Father 35  . Prostate cancer Father   . Other Sister 72       platelet disorder  . Colon cancer Neg Hx   . Esophageal cancer Neg Hx   . Pancreatic cancer Neg Hx   . Stomach cancer Neg Hx   . Rectal cancer Neg Hx     Social History   Tobacco Use  . Smoking status: Never Smoker  . Smokeless tobacco: Never Used  Substance Use Topics  . Alcohol use: Yes    Alcohol/week: 0.0 standard drinks    Comment: wine occasional  . Drug use: No    ROS: As per history of present illness, otherwise negative  Ht 5\' 2"  (1.575 m)   Wt 198 lb (89.8 kg)   LMP 12/21/2013   BMI 36.21 kg/m  No PE, virtual visit   RELEVANT LABS AND IMAGING: CBC    Component Value Date/Time   WBC 6.5 10/01/2016 1529   WBC 7.4 08/25/2013 1151   RBC 4.73 10/01/2016 1529   RBC 4.55 08/25/2013 1151   HGB 13.4 10/01/2016 1529   HCT 38.7 10/01/2016 1529   PLT 230 10/01/2016 1529   MCV 82 10/01/2016 1529   MCH 28.3 10/01/2016 1529   MCHC 34.6 10/01/2016 1529   MCHC 33.7 08/25/2013 1151   RDW 14.2 10/01/2016 1529   LYMPHSABS 2.2 10/01/2016 1529   MONOABS 0.7 08/25/2013 1151   EOSABS 0.2 10/01/2016 1529   BASOSABS 0.0 10/01/2016 1529    CMP     Component Value Date/Time   NA 143 10/01/2016 1529   K 4.0 10/01/2016 1529   CL 101 10/01/2016 1529   CO2 30 (H) 10/01/2016 1529   GLUCOSE 91 10/01/2016 1529   GLUCOSE 91 08/25/2013 1151   BUN 8 10/01/2016 1529   CREATININE 0.63 10/01/2016 1529   CALCIUM 9.3 10/01/2016 1529   PROT 7.0 11/03/2016 1207   ALBUMIN 4.1 11/03/2016 1207   AST 16 11/03/2016 1207   ALT 21 11/03/2016 1207   ALKPHOS 43 11/03/2016 1207   BILITOT 1.0 11/03/2016 1207   GFRNONAA 103 10/01/2016 1529   GFRAA 118 10/01/2016 1529   ULTRASOUND ABDOMEN LIMITED RIGHT UPPER QUADRANT   COMPARISON:  Abdominal ultrasound of Aug 02, 2007.   FINDINGS: Gallbladder:   The gallbladder is adequately distended. There is an echogenic stone in the gallbladder neck  measuring 2.5 cm in diameter. These not clearly mobile. There is no gallbladder wall thickening, pericholecystic fluid, or positive sonographic Murphy's sign.   Common bile duct:   Diameter: 2.8 mm   Liver:   The hepatic echotexture is mildly increased diffusely. The surface contour  of the liver is smooth. There is no focal mass nor ductal dilation. Portal vein is patent on color Doppler imaging with normal direction of blood flow towards the liver.   IMPRESSION: There is at least 1 gallstone. There is no sonographic evidence of acute cholecystitis.   Increased hepatic echotexture most compatible with fatty infiltrative change.     Electronically Signed   By: David  Martinique M.D.   On: 11/17/2016 10:49  ASSESSMENT/PLAN: 58 year old female with a past medical history of benign esophageal stricture status post dilation to 14.5 mm last in November 2018, 3 cm hiatal hernia, fatty liver, history of gallstones, hypertension who is seen in follow-up.  She is seen virtually today in the setting of COVID-19.  1.  Right upper quadrant pain/fatty liver --we discussed that these issues may be quite different.  Fatty liver rarely causes significant abdominal pain.  I feel that her pain may be more consistent with her known gallstones.  Also given history of steatosis and report of elevated liver enzymes I feel it prudent to repeat lab testing and imaging as follows: --Abdominal ultrasound with elastography --CBC, CMP, INR, hepatitis B surface antigen, hepatitis B surface antibody, hepatitis B core total, hepatitis C antibody, hepatitis A total antibody, ANA, IgG, anti-smooth muscle antibody, anti-mitochondrial antibody, celiac panel, ferritin --Were going to perform an EGD for dilation given dysphagia but this will also evaluate the right upper quadrant abdominal pain.  We discussed the risk, benefits and alternatives and she is agreeable and wishes to proceed --Trial of PPI therapy, pantoprazole  40 mg 30 minutes before breakfast daily x4 to 6 weeks.  This will also be a trial to see if her upper abdominal pain resolves with PPI.  I discussed with her that gallbladder pain would not improve with PPI therapy.  Low-fat diet is recommended  2.  Esophageal dysphagia with history of esophageal stricture --recurrent pill dysphagia after last dilation to 14.5 mm in November 2018.  Repeat upper endoscopy with dilation is recommended and will be scheduled --EGD in the [place] for dilation  3.  CRC screening --colonoscopy 2018, repeat 2028

## 2018-10-06 ENCOUNTER — Telehealth: Payer: Self-pay | Admitting: *Deleted

## 2018-10-06 MED ORDER — PANTOPRAZOLE SODIUM 40 MG PO TBEC: 40.0000 mg | DELAYED_RELEASE_TABLET | Freq: Every day | ORAL | 1 refills | Status: DC

## 2018-10-06 NOTE — Addendum Note (Signed)
Addended by: Larina Bras on: 10/06/2018 03:06 PM   Modules accepted: Orders

## 2018-10-06 NOTE — Telephone Encounter (Signed)
I have left a message for patient to call back. I need to go over her information regarding recommendations from last office visit and she needs to be scheduled for endoscopy procedure.

## 2018-10-06 NOTE — Patient Instructions (Addendum)
You have been scheduled for an abdominal ultrasound with elastography at [facility] (1st floor). Your appointment is scheduled for 10/12/2018 at 11:00 am. Please arrive 15 minutes prior to your scheduled appointment for registration purposes. Make certain not to have anything to eat or drink 6 hours prior to your procedure. Should you need to reschedule your appointment, you may contact radiology at [phone].  Liver Elastography Various chronic liver diseases such as hepatitis B, C, and fatty liver disease can lead to tissue damage and subsequent scar tissue formation. As the scar tissue accumulates, the liver loses some of its elasticity and becomes stiffer. Liver elastography involves the use of a surface ultrasound probe that delivers a low frequency pulse or shear wave to a small volume of liver tissue under the rib cage. The transmission of the sound wave is completely painless. How Is a Liver Elastography Performed? The liver is located in the right upper abdomen under the rib cage. Patients are asked to lie flat on an examination table. A technician places the FibroScan probe between the ribs on the right side of the lower chest wall. A series of 10 painless pulses are then applied to the liver. The results are recorded on the equipment and an overall liver stiffness score is generated. This score is then interpreted by a qualified physician to predict the likelihood of advanced fibrosis or cirrhosis.  Patients are asked to wear loose clothing and should not consume any liquids or solids for a minimum of 4 hours before the test to increase the likelihood of obtaining reliable test results. The scan will take 10 to 15 minutes to complete, but patients should plan on being available for 30 minutes to allow time for preparation ____________________________________________________ Your provider has requested that you go to the basement level for lab work. Press "B" on the elevator. The lab  is located at the first door on the left as you exit the elevator. _____________________________________________________   Were going to perform an EGD for dilation given dysphagia but this will also evaluate the right upper quadrant abdominal pain.  We discussed the risk, benefits and alternatives and she is agreeable and wishes to proceed _____________________________________________________ Pantoprazole 40 mg 30 minutes before breakfast daily x 4 to 6 weeks.  ______________________________________________________ Please try to follow a low-fat diet.  ______________________________________________________ Lindsey Conley will be due for a recall colonoscopy in 01/2027. We will send you a reminder in the mail when it gets closer to that time. ______________________________________________________ Lindsey Conley have been scheduled for an endoscopy. Please follow written instructions given to you. If you use inhalers (even only as needed), please bring them with you on the day of your procedure. ______________________________________________________  If you are age 56 or older, your body mass index should be between 23-30. Your Body mass index is 36.21 kg/m. If this is out of the aforementioned range listed, please consider follow up with your Primary Care Provider.  If you are age 56 or younger, your body mass index should be between 19-25. Your Body mass index is 36.21 kg/m. If this is out of the aformentioned range listed, please consider follow up with your Primary Care Provider.

## 2018-10-07 NOTE — Addendum Note (Signed)
Addended by: Larina Bras on: 10/07/2018 11:09 AM   Modules accepted: Orders

## 2018-10-07 NOTE — Telephone Encounter (Signed)
Patient has scheduled endoscopy for 11/16/18 and will come on 10/12/18 at 10 am so I can go over instructions with her and she can sign paperwork. She is also advised of Dr Vena Rua other recommendations as well as appointment for elastography. I have mailed a copy of instructions to her home address. She verbalizes understanding.

## 2018-10-07 NOTE — Telephone Encounter (Signed)
The patient returned your call from yesterday. She asked if you would contact her back.

## 2018-10-12 ENCOUNTER — Ambulatory Visit (HOSPITAL_COMMUNITY)
Admission: RE | Admit: 2018-10-12 | Discharge: 2018-10-12 | Disposition: A | Discharge status: Home / Self Care | Payer: BC Managed Care – PPO | Source: Ambulatory Visit | Attending: Internal Medicine | Admitting: Internal Medicine

## 2018-10-12 ENCOUNTER — Other Ambulatory Visit: Payer: Self-pay

## 2018-10-12 ENCOUNTER — Other Ambulatory Visit (INDEPENDENT_AMBULATORY_CARE_PROVIDER_SITE_OTHER): Payer: BC Managed Care – PPO

## 2018-10-12 DIAGNOSIS — K222 Esophageal obstruction: Secondary | ICD-10-CM | POA: Diagnosis not present

## 2018-10-12 DIAGNOSIS — K8020 Calculus of gallbladder without cholecystitis without obstruction: Secondary | ICD-10-CM

## 2018-10-12 DIAGNOSIS — R1011 Right upper quadrant pain: Secondary | ICD-10-CM | POA: Diagnosis not present

## 2018-10-12 DIAGNOSIS — K760 Fatty (change of) liver, not elsewhere classified: Secondary | ICD-10-CM | POA: Diagnosis present

## 2018-10-12 DIAGNOSIS — R1319 Other dysphagia: Secondary | ICD-10-CM

## 2018-10-12 DIAGNOSIS — R1310 Dysphagia, unspecified: Secondary | ICD-10-CM

## 2018-10-12 LAB — CBC WITH DIFFERENTIAL/PLATELET
Basophils Absolute: 0 10*3/uL (ref 0.0–0.1)
Basophils Relative: 0.7 % (ref 0.0–3.0)
Eosinophils Absolute: 0.1 10*3/uL (ref 0.0–0.7)
Eosinophils Relative: 1.4 % (ref 0.0–5.0)
HCT: 42.1 % (ref 36.0–46.0)
Hemoglobin: 14 g/dL (ref 12.0–15.0)
Lymphocytes Relative: 28.8 % (ref 12.0–46.0)
Lymphs Abs: 1.8 10*3/uL (ref 0.7–4.0)
MCHC: 33.2 g/dL (ref 30.0–36.0)
MCV: 85.2 fl (ref 78.0–100.0)
Monocytes Absolute: 0.5 10*3/uL (ref 0.1–1.0)
Monocytes Relative: 7.4 % (ref 3.0–12.0)
Neutro Abs: 3.8 10*3/uL (ref 1.4–7.7)
Neutrophils Relative %: 61.7 % (ref 43.0–77.0)
Platelets: 229 10*3/uL (ref 150.0–400.0)
RBC: 4.95 Mil/uL (ref 3.87–5.11)
RDW: 14 % (ref 11.5–15.5)
WBC: 6.2 10*3/uL (ref 4.0–10.5)

## 2018-10-12 LAB — COMPREHENSIVE METABOLIC PANEL
ALT: 36 U/L — ABNORMAL HIGH (ref 0–35)
AST: 24 U/L (ref 0–37)
Albumin: 4.2 g/dL (ref 3.5–5.2)
Alkaline Phosphatase: 52 U/L (ref 39–117)
BUN: 11 mg/dL (ref 6–23)
CO2: 29 mEq/L (ref 19–32)
Calcium: 9.6 mg/dL (ref 8.4–10.5)
Chloride: 103 mEq/L (ref 96–112)
Creatinine, Ser: 0.8 mg/dL (ref 0.40–1.20)
GFR: 74.22 mL/min (ref >60.00)
Glucose, Bld: 100 mg/dL — ABNORMAL HIGH (ref 70–99)
Potassium: 3.7 mEq/L (ref 3.5–5.1)
Sodium: 140 mEq/L (ref 135–145)
Total Bilirubin: 0.7 mg/dL (ref 0.2–1.2)
Total Protein: 7.3 g/dL (ref 6.0–8.3)

## 2018-10-12 LAB — IGA: IgA: 354 mg/dL (ref 68–378)

## 2018-10-12 LAB — PROTIME-INR
INR: 1 ratio (ref 0.8–1.0)
Prothrombin Time: 11.7 s (ref 9.6–13.1)

## 2018-10-12 LAB — FERRITIN: Ferritin: 43.2 ng/mL (ref 10.0–291.0)

## 2018-10-14 LAB — HEPATITIS C ANTIBODY
Hepatitis C Ab: NONREACTIVE
SIGNAL TO CUT-OFF: 0.01 (ref <1.00)

## 2018-10-14 LAB — HEPATITIS B CORE ANTIBODY, TOTAL: Hep B Core Total Ab: NONREACTIVE

## 2018-10-14 LAB — ANTI-SMOOTH MUSCLE ANTIBODY, IGG: Actin (Smooth Muscle) Antibody (IGG): <20 U (ref <20)

## 2018-10-14 LAB — IGG: IgG (Immunoglobin G), Serum: 1076 mg/dL (ref 600–1640)

## 2018-10-14 LAB — ANA: Anti Nuclear Antibody (ANA): NEGATIVE

## 2018-10-14 LAB — TISSUE TRANSGLUTAMINASE, IGA: (tTG) Ab, IgA: 1 U/mL

## 2018-10-14 LAB — HEPATITIS B SURFACE ANTIBODY,QUALITATIVE: Hep B S Ab: NONREACTIVE

## 2018-10-14 LAB — HEPATITIS A ANTIBODY, TOTAL: Hepatitis A AB,Total: NONREACTIVE

## 2018-10-14 LAB — MITOCHONDRIAL ANTIBODIES: Mitochondrial M2 Ab, IgG: <=20 U

## 2018-10-14 LAB — HEPATITIS B SURFACE ANTIGEN: Hepatitis B Surface Ag: NONREACTIVE

## 2018-10-28 ENCOUNTER — Ambulatory Visit (INDEPENDENT_AMBULATORY_CARE_PROVIDER_SITE_OTHER): Payer: BC Managed Care – PPO | Admitting: Vascular Surgery

## 2018-10-28 ENCOUNTER — Ambulatory Visit (HOSPITAL_COMMUNITY)
Admission: RE | Admit: 2018-10-28 | Discharge: 2018-10-28 | Disposition: A | Discharge status: Home / Self Care | Payer: BC Managed Care – PPO | Source: Ambulatory Visit | Attending: Vascular Surgery | Admitting: Vascular Surgery

## 2018-10-28 ENCOUNTER — Other Ambulatory Visit: Payer: Self-pay

## 2018-10-28 ENCOUNTER — Encounter: Payer: Self-pay | Admitting: Vascular Surgery

## 2018-10-28 VITALS — BP 143/89 | HR 81 | Temp 98.4°F | Resp 16 | Ht 63.0 in | Wt 199.0 lb

## 2018-10-28 DIAGNOSIS — I83893 Varicose veins of bilateral lower extremities with other complications: Secondary | ICD-10-CM | POA: Diagnosis not present

## 2018-10-28 DIAGNOSIS — R609 Edema, unspecified: Secondary | ICD-10-CM

## 2018-10-28 NOTE — Progress Notes (Signed)
Patient ID: Lindsey Conley, female   DOB: 22-Jul-1960, 58 y.o.   MRN: [id number]  Reason for Consult: New Patient (Initial Visit) (bilateral reflux with pain)   Referred by Bobette Mo*  Subjective:     HPI:  Lindsey Conley is a 58 y.o. female without significant vascular history presents with bilateral lower extremity swelling.  She states this is worse after being on her feet most of the day.  She does have some spider veins.  She has never had any DVT.  She denies varicosities.  Recently has gained approximately 10 pounds.  Does not take any fluid pills.  Has no known heart issues other than mild aortic insufficiency diagnosed 2015.  She can walk without issue.  Does not take blood thinners.  Past Medical History:  Diagnosis Date  . Anemia   . Anxiety   . Dyspnea    a. ETT 2015: poor exercise tolerance but nonischemic.  . Gallstone   . GERD (gastroesophageal reflux disease)   . History of colonoscopy   . HTN (hypertension)   . Migraine   . Mild AI (aortic insufficiency)    a. by echo 2015.  [name] Palpitations    echo and Holter in 2009 were normal  . Status post dilation of esophageal narrowing    Family History  Problem Relation Age of Onset  . Asthma Mother 28  . Other Mother        palpitations  . Hypertension Father 80  . Prostate cancer Father   . Other Sister 4       platelet disorder  . Colon cancer Neg Hx   . Esophageal cancer Neg Hx   . Pancreatic cancer Neg Hx   . Stomach cancer Neg Hx   . Rectal cancer Neg Hx    Past Surgical History:  Procedure Laterality Date  . DILATION AND CURETTAGE OF UTERUS    . HYSTEROSCOPY      Short Social History:  Social History   Tobacco Use  . Smoking status: Never Smoker  . Smokeless tobacco: Never Used  Substance Use Topics  . Alcohol use: Yes    Alcohol/week: 0.0 standard drinks    Comment: wine occasional    Allergies  Allergen Reactions  . Phenergan [Promethazine Hcl]     Rash  . Promethazine  Hcl   . Penicillins Palpitations and Rash    Current Outpatient Medications  Medication Sig Dispense Refill  . ALPRAZolam (XANAX) 0.25 MG tablet Take 0.25 mg by mouth at bedtime as needed.      . Cholecalciferol (DIALYVITE VITAMIN D 5000 PO) Take 1-2 tablets by mouth daily.    [name] ibuprofen (ADVIL) 200 MG tablet Take 200 mg by mouth every 6 (six) hours as needed.    . Melatonin 1 MG SUBL Place 2 mg under the tongue at bedtime as needed.    . pantoprazole (PROTONIX) 40 MG tablet Take 1 tablet (40 mg total) by mouth daily. 30 minutes before breakfast 30 tablet 1   No current facility-administered medications for this visit.     Review of Systems  Constitutional:  Constitutional negative. HENT: HENT negative.  Eyes: Eyes negative.  Respiratory: Respiratory negative.  Cardiovascular: Positive for leg swelling.  GI: Gastrointestinal negative.  Musculoskeletal: Musculoskeletal negative.  Skin: Skin negative.  Neurological: Neurological negative. Hematologic: Hematologic/lymphatic negative.  Psychiatric: Psychiatric negative.        Objective:  Objective   Vitals:   10/28/18 1205  BP: (!) 143/89  Pulse: 81  Resp: 16  Temp: 98.4 F (36.9 C)  TempSrc: Temporal  SpO2: 99%  Weight: 199 lb (90.3 kg)  Height: 5\' 3"  (1.6 m)   Body mass index is 35.25 kg/m.  Physical Exam HENT:     Head: Normocephalic.     Nose: Nose normal.     Mouth/Throat:     Mouth: Mucous membranes are moist.  Eyes:     Pupils: Pupils are equal, round, and reactive to light.  Cardiovascular:     Rate and Rhythm: Normal rate.     Pulses:          Radial pulses are 2+ on the right side and 2+ on the left side.       Popliteal pulses are 2+ on the right side and 2+ on the left side.       Dorsalis pedis pulses are 2+ on the right side and 2+ on the left side.       Posterior tibial pulses are 2+ on the right side and 2+ on the left side.  Pulmonary:     Effort: Pulmonary effort is normal.  Abdominal:      General: Abdomen is flat.     Palpations: Abdomen is soft.  Musculoskeletal: Normal range of motion.        General: No swelling.     Comments: Few telangiectasias bilateral lower legs  Skin:    General: Skin is warm and dry.     Capillary Refill: Capillary refill takes less than 2 seconds.  Neurological:     General: No focal deficit present.     Mental Status: She is alert and oriented to person, place, and time.  Psychiatric:        Mood and Affect: Mood normal.     Data: I independently interpreted her bilateral lower extremity venous reflux studies which demonstrate reflux in the small saphenous veins bilaterally although the only measure approximately 0.2 cm.  There is an anterior accessory saphenous vein on the left with reflux.  Greater saphenous vein on the right measures 0.92 cm at the junction down to 0.18 cm of the distal thigh.  Left is 0.91 cm down to 0.18 cm in the mid thigh.  Assessment/Plan:     58 year old female bilateral lower extremity swelling.  She states that she is not very active swelling only bothers her minimally.  Does not appear to have significant venous reflux for intervention.  I have recommended conservative measures with compression stockings as tolerated as well as elevation of her legs when recumbent.  She would also benefit from weight loss and more activity.  She demonstrates good understanding and follow-up on an as-needed basis.     Waynetta Sandy MD [facility]

## 2018-11-01 ENCOUNTER — Other Ambulatory Visit: Payer: Self-pay | Admitting: Surgery

## 2018-11-01 DIAGNOSIS — E042 Nontoxic multinodular goiter: Secondary | ICD-10-CM

## 2018-11-10 ENCOUNTER — Ambulatory Visit
Admission: RE | Admit: 2018-11-10 | Discharge: 2018-11-10 | Disposition: A | Discharge status: Home / Self Care | Payer: BC Managed Care – PPO | Source: Ambulatory Visit | Attending: Surgery | Admitting: Surgery

## 2018-11-10 DIAGNOSIS — E042 Nontoxic multinodular goiter: Secondary | ICD-10-CM

## 2018-11-16 ENCOUNTER — Encounter: Payer: BC Managed Care – PPO | Admitting: Internal Medicine

## 2018-11-22 ENCOUNTER — Encounter: Payer: Self-pay | Admitting: Internal Medicine

## 2018-11-28 ENCOUNTER — Encounter: Payer: BC Managed Care – PPO | Admitting: Internal Medicine

## 2018-12-12 ENCOUNTER — Telehealth: Payer: Self-pay | Admitting: Internal Medicine

## 2018-12-12 NOTE — Telephone Encounter (Signed)
Ok, noted Please call to reschedule Of note, it appears she has canceled the last 3 scheduled procedures

## 2018-12-12 NOTE — Telephone Encounter (Signed)
PT NOT FEELING & REQUESTS WE CALL HER TO RESCHEDULE

## 2018-12-13 ENCOUNTER — Encounter: Payer: BC Managed Care – PPO | Admitting: Internal Medicine

## 2018-12-13 ENCOUNTER — Telehealth: Payer: Self-pay | Admitting: Internal Medicine

## 2018-12-13 NOTE — Telephone Encounter (Signed)
Pt to call back when she is ready to reschedule.

## 2018-12-13 NOTE — Telephone Encounter (Signed)
Lindsey Conley will you contact this pt and reschedule her EGD with dilation in the [place]? She cancelled her appt for 10/6 and needs to reschedule.

## 2018-12-13 NOTE — Telephone Encounter (Signed)
Hi Linda, I spoke with pt. She did not want to r/s yet. She stated that she has not been feeling well, she is seeing a doctor today and will call back to r/s as soon as she feels better.

## 2019-02-08 ENCOUNTER — Encounter

## 2019-04-13 IMAGING — CT CT HEART MORP W/ CTA COR W/ SCORE W/ CA W/CM &/OR W/O CM
4 of 7 series · 8 of 20 positions shown, 9 images · IV contrast (APPLIED)
Comparison: None.

CLINICAL DATA: 57-year-old female with chest pain.

EXAM:
Cardiac/Coronary  CT
TECHNIQUE: The patient was scanned on a [employer] Force scanner.

[Series 6: best diast 76 % · axial · 0.29mm/px · z∈[+1138,+1175]mm · 2 of 276 slices shown, 3 images]
[im 92/276  vessel]
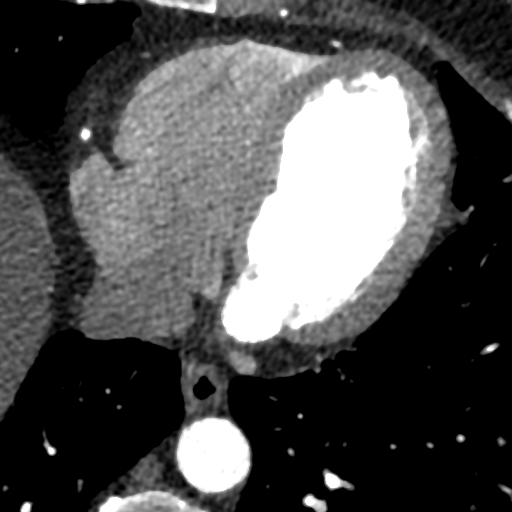
[im 92/276  lung]
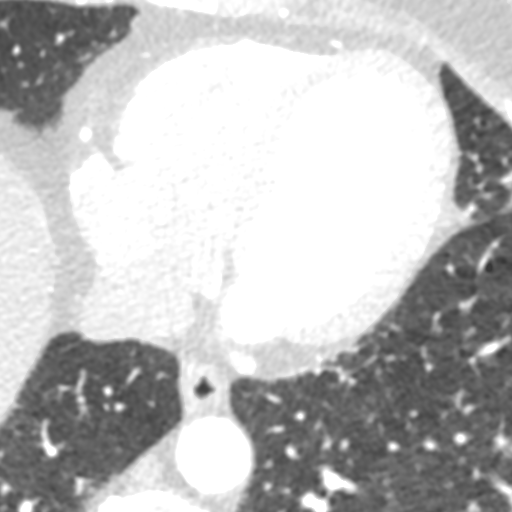
[im 184/276  vessel]
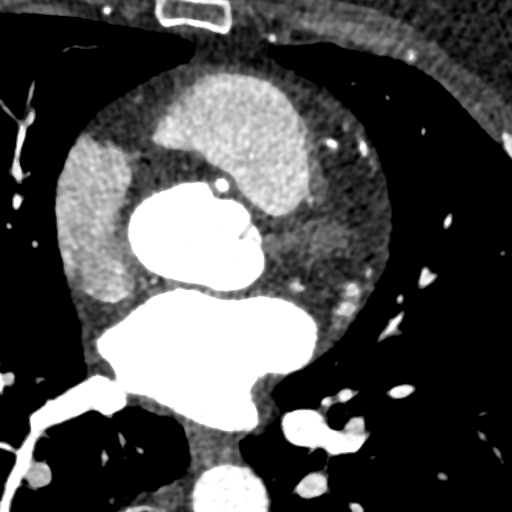

[Series 7: best syst 47 % · axial · 0.29mm/px · z∈[+1138,+1175]mm · 2 of 276 slices shown]
[im 92/276  vessel]
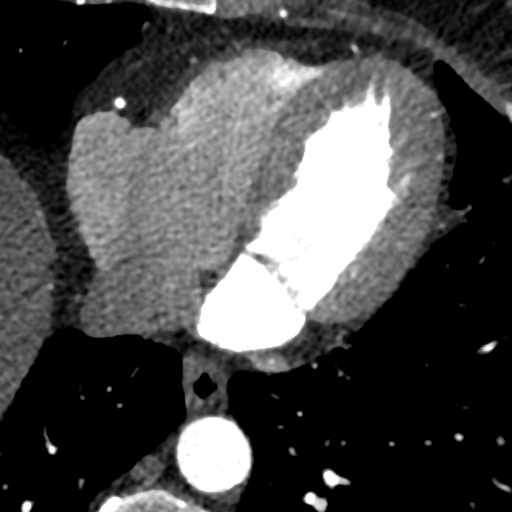
[im 184/276  vessel]
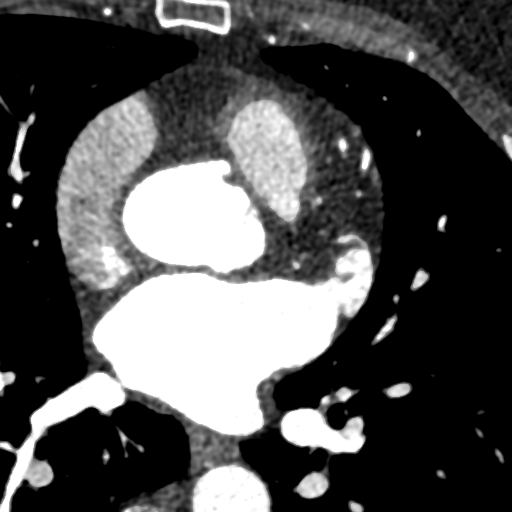

[Series 8: ts diast sharp 76 % · axial · 0.29mm/px · z∈[+1138,+1175]mm · 2 of 276 slices shown]
[im 92/276  lung]
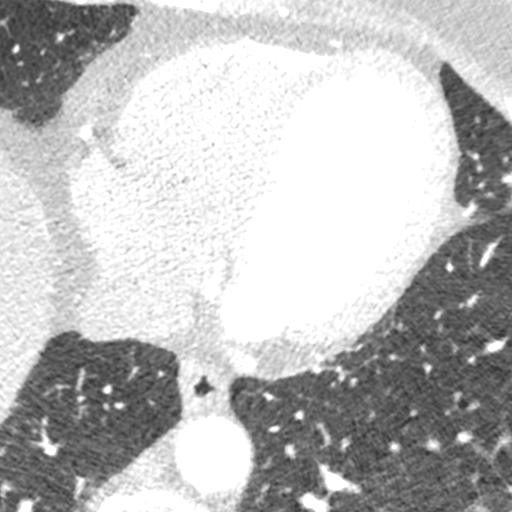
[im 184/276  lung]
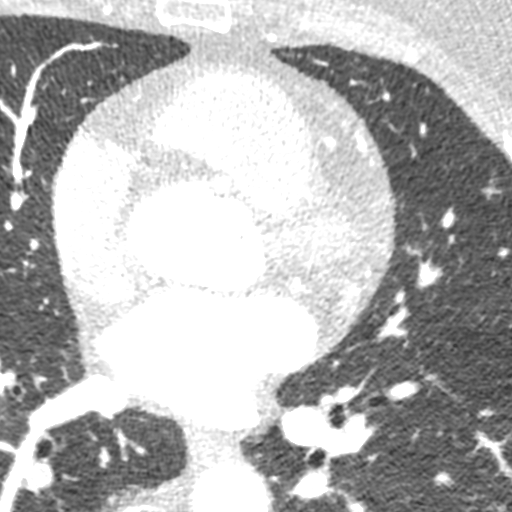

[Series 9: ts syst sharp 47 % · axial · 0.29mm/px · z∈[+1138,+1175]mm · 2 of 276 slices shown]
[im 92/276  lung]
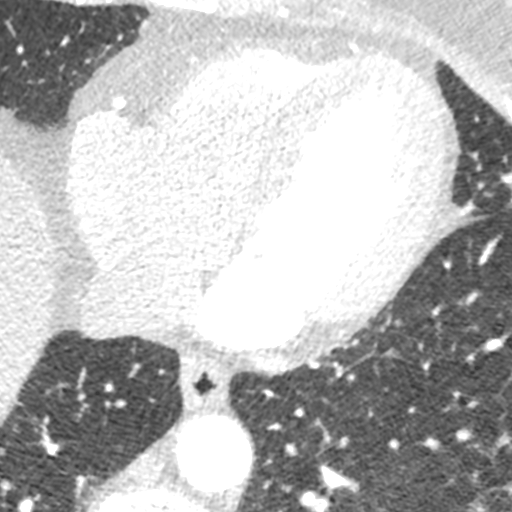
[im 184/276  lung]
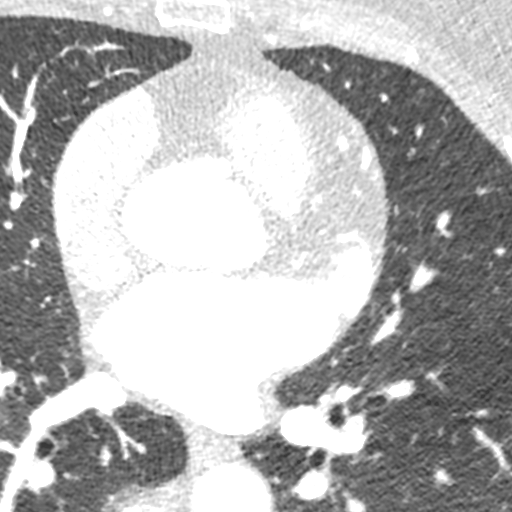

[8 of 20 positions shown; findings below may reference images not displayed]



Aorta:  Normal size.  No calcifications.  No dissection.

Aortic Valve:  Trileaflet.  No calcifications.

Coronary Arteries:  Normal coronary origin.  Right dominance.

RCA is a large dominant artery that gives rise to PDA and PLVB.
There is no plaque.

Left main is a large artery that gives rise to LAD and LCX arteries.
Left main has no plaque.

LAD is a large vessel that gives rise to a large diagonal artery
that supplies the entire lateral wall and has no plaque.

LCX is a small non-dominant artery that has no obvious plaque.

Other findings:

Normal pulmonary vein drainage into the left atrium.

Normal let atrial appendage without a thrombus.

Normal size of the pulmonary artery.
IMPRESSION: 1. Coronary calcium score of 0. This was 0 percentile for age and
sex matched control.

2. Normal coronary origin with right dominance.

3. No evidence of CAD.

Nazareth Jumper

EXAM:
OVER-READ INTERPRETATION  CT CHEST

The following report is an over-read performed by radiologist Dr.
Jayarama Mandujano [[id number]] on 11/25/2017. This
over-read does not include interpretation of cardiac or coronary
anatomy or pathology. The coronary calcium score/coronary CTA
interpretation by the cardiologist is attached.
FINDINGS: Within the visualized portions of the thorax there are no suspicious
appearing pulmonary nodules or masses, there is no acute
consolidative airspace disease, no pleural effusions, no
pneumothorax and no lymphadenopathy. Visualized portions of the
upper abdomen are unremarkable. There are no aggressive appearing
lytic or blastic lesions noted in the visualized portions of the
skeleton.
IMPRESSION: No significant incidental noncardiac findings are noted.

## 2019-04-14 ENCOUNTER — Ambulatory Visit: Payer: BC Managed Care – PPO | Admitting: Cardiology

## 2019-06-20 ENCOUNTER — Ambulatory Visit: Payer: BC Managed Care – PPO | Admitting: Cardiology

## 2019-09-12 ENCOUNTER — Encounter: Payer: Self-pay | Admitting: Physician Assistant

## 2019-09-12 NOTE — Progress Notes (Addendum)
Cardiology Office Note    Date:  05/12/5730   ID:  Lindsey Conley, DOB [birth year], MRN [id number]  PCP:  Allayne Butcher, NP  Cardiologist:  Ena Dawley, MD  Electrophysiologist:  None   Chief Complaint: f/u palpitations, aortic insufficiency  History of Present Illness:   Lindsey Conley is a 59 y.o. 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   She has followed in our clinic for history of palpitations and dyspnea with benign workup. Remote Holter reportedly normal. Echo in 2008 was unremarkable. OV in 2012 with Dr. Haroldine Laws raised question of symptoms r/t panic attacks.  ETT 10/2013 showed poor exercise tolerance but no evidence of ischemia (total exercise time 31min) - walking program recommended. Last echo 11/2016 showed EF 55-60%, grade 1 DD, mild AI. Carotid US 11/2017 showed no significant carotid disease, but thyroid nodules seen for which she subsequently has had workup/surveillance. Coronary CTA 11/2017 showed no CAD. She self-reports a history of anxiety and stress surrounding caring for others in her family.  She is seen back today for follow-up. Her PCP has since put her on Toprol for elevated blood pressure. This was initially diagnosed earlier this year while going through home stress. She was trialed on amlodipine but felt constipation and hair loss so was changed to metoprolol at low dose. Overall she has done OK. The palpitations have improved. About twice in the last year she's had a fleeting sensation of dyspnea like she needs to take a deep breath. She cares for her grandchildren but does not formally exercise. No angina reported with ADLs. She reports chronic fatigue which she feels is related to being overweight and not exercising. She does not snore. Her husband has to get up early so as a result she has a disrupted sleep schedule as well. She also reports intermittent leg  swelling after long car rides, being seated for a while, or trips to [place]. This is not present today.   Labwork independently reviewed:  10/2018 normal CBC, normal CMET except ALT 36, glucose 100 09/2016 TSH wnl, Mg 2.3   Past Medical History:  Diagnosis Date  . Anemia   . Anxiety   . Dyspnea    a. ETT 2015: poor exercise tolerance but nonischemic. b. Normal cor CT 2019.  . Gallstone   . GERD (gastroesophageal reflux disease)   . History of colonoscopy   . HTN (hypertension)   . Migraine   . Mild AI (aortic insufficiency)   . Palpitations    echo and Holter in 2009 were normal  . Status post dilation of esophageal narrowing     Past Surgical History:  Procedure Laterality Date  . DILATION AND CURETTAGE OF UTERUS    . HYSTEROSCOPY      Current Medications: Current Meds  Medication Sig  . ALPRAZolam (XANAX) 0.25 MG tablet Take 0.25 mg by mouth at bedtime as needed.    [name] aspirin EC 81 MG tablet Take 81 mg by mouth daily. Swallow whole.  . Cholecalciferol (DIALYVITE VITAMIN D 5000 PO) Take 1-2 tablets by mouth daily.  [name] ibuprofen (ADVIL) 200 MG tablet Take 200 mg by mouth every 6 (six) hours as needed.  . Melatonin 1 MG SUBL Place 2 mg under the tongue at bedtime as needed.  . metoprolol succinate (TOPROL-XL) 25 MG 24 hr tablet Take 25 mg by mouth daily.      Allergies:   Phenergan [promethazine  hcl], Promethazine hcl, and Penicillins   Social History   Socioeconomic History  . Marital status: Married    Spouse name: Not on file  . Number of children: 5  . Years of education: Not on file  . Highest education level: Not on file  Occupational History  . Occupation: care giver  Tobacco Use  . Smoking status: Never Smoker  . Smokeless tobacco: Never Used  Vaping Use  . Vaping Use: Never used  Substance and Sexual Activity  . Alcohol use: Yes    Alcohol/week: 0.0 standard drinks    Comment: wine occasional  . Drug use: No  . Sexual activity: Not on file    Other Topics Concern  . Not on file  Social History Narrative  . Not on file   Social Determinants of Health   Financial Resource Strain:   . Difficulty of Paying Living Expenses:   Food Insecurity:   . Worried About Charity fundraiser in the Last Year:   . Arboriculturist in the Last Year:   Transportation Needs:   . Film/video editor (Medical):   [name] Lack of Transportation (Non-Medical):   Physical Activity:   . Days of Exercise per Week:   . Minutes of Exercise per Session:   Stress:   . Feeling of Stress :   Social Connections:   . Frequency of Communication with Friends and Family:   . Frequency of Social Gatherings with Friends and Family:   . Attends Religious Services:   . Active Member of Clubs or Organizations:   . Attends Archivist Meetings:   [name] Marital Status:      Family History:  The patient's family history includes Asthma (age of onset: 18) in her mother; Hypertension (age of onset: 32) in her father; Other in her mother; Other (age of onset: 55) in her sister; Prostate cancer in her father. There is no history of Colon cancer, Esophageal cancer, Pancreatic cancer, Stomach cancer, or Rectal cancer.  ROS:   Please see the history of present illness.  All other systems are reviewed and otherwise negative.    EKGs/Labs/Other Studies Reviewed:    Studies reviewed are outlined and summarized above. Reports included below if pertinent.  2d echo 11/2016 - Left ventricle: The cavity size was normal. Wall thickness was  normal. Systolic function was normal. The estimated ejection  fraction was in the range of 55% to 60%. Wall motion was normal;  there were no regional wall motion abnormalities. Doppler  parameters are consistent with abnormal left ventricular  relaxation (grade 1 diastolic dysfunction).  - Aortic valve: There was mild regurgitation.  - Tricuspid valve: There was trivial regurgitation.   Impressions:   - Compared to  the prior study, there has been no significant  interval change.   Cor CT 11/2017 ADDENDUM REPORT: 11/26/2017 08:01  CLINICAL DATA:  59 year old female with chest pain.  EXAM: Cardiac/Coronary  CT  TECHNIQUE: The patient was scanned on a [employer].  FINDINGS: A 120 kV prospective scan was triggered in the descending thoracic aorta at 111 HU's. Axial non-contrast 3 mm slices were carried out through the heart. The data set was analyzed on a dedicated work station and scored using the [place]. Gantry rotation speed was 250 msecs and collimation was .6 mm. No beta blockade and 0.8 mg of sl NTG was given. The 3D data set was reconstructed in 5% intervals of the 67-82 % of the R-R cycle.  Diastolic phases were analyzed on a dedicated work station using MPR, MIP and VRT modes. The patient received 80 cc of contrast.  Aorta:  Normal size.  No calcifications.  No dissection.  Aortic Valve:  Trileaflet.  No calcifications.  Coronary Arteries:  Normal coronary origin.  Right dominance.  RCA is a large dominant artery that gives rise to PDA and PLVB. There is no plaque.  Left main is a large artery that gives rise to LAD and LCX arteries. Left main has no plaque.  LAD is a large vessel that gives rise to a large diagonal artery that supplies the entire lateral wall and has no plaque.  LCX is a small non-dominant artery that has no obvious plaque.  Other findings:  Normal pulmonary vein drainage into the left atrium.  Normal let atrial appendage without a thrombus.  Normal size of the pulmonary artery.  IMPRESSION: 1. Coronary calcium score of 0. This was 0 percentile for age and sex matched control.  2. Normal coronary origin with right dominance.  3. No evidence of CAD.  Ena Dawley   Electronically Signed   By: Ena Dawley   On: 11/26/2017 08:01  IMPRESSION: No significant incidental noncardiac findings are  noted.  Electronically Signed: By: Vinnie Langton M.D. On: 11/25/2017 15:27    EKG:  EKG is ordered today, personally reviewed, demonstrating NSR 75bpm, no acute STT changes  Recent Labs: 10/12/2018: ALT 36; BUN 11; Creatinine, Ser 0.80; Hemoglobin 14.0; Platelets 229.0; Potassium 3.7; Sodium 140  Recent Lipid Panel    Component Value Date/Time   CHOL 172 12/27/2013 0958   TRIG 67.0 12/27/2013 0958   HDL 55.60 12/27/2013 0958   CHOLHDL 3 12/27/2013 0958   VLDL 13.4 12/27/2013 0958   LDLCALC 103 (H) 12/27/2013 0958    PHYSICAL EXAM:    VS:  BP (!) 124/100   Pulse 75   Ht 5\' 3"  (1.6 m)   Wt 196 lb 6.4 oz (89.1 kg)   LMP 12/21/2013   SpO2 97%   BMI 34.79 kg/m   BMI: Body mass index is 34.79 kg/m.  GEN: Well nourished, well developed WF, in no acute distress HEENT: normocephalic, atraumatic Neck: no JVD, carotid bruits, or masses Cardiac: RRR; no murmurs, rubs, or gallops, no edema  Respiratory:  clear to auscultation bilaterally, normal work of breathing GI: soft, nontender, nondistended, + BS MS: no deformity or atrophy Skin: warm and dry, no rash Neuro:  Alert and Oriented x 3, Strength and sensation are intact, follows commands Psych: euthymic mood, full affect  Wt Readings from Last 3 Encounters:  09/14/19 196 lb 6.4 oz (89.1 kg)  10/28/18 199 lb (90.3 kg)  10/04/18 198 lb (89.8 kg)     ASSESSMENT & PLAN:   1. History of palpitations - improved on metoprolol, will continue for now.  2. History of shortness of breath - symptoms atypical, do not suspect significant concerning cardiac issue contributing. Rare, transient, fleeting. If this becomes more frequent in the future, could consider repeat event monitor to exclude arrhythmia. Hyperawareness of body systems is also in the differential. 3. Mild aortic insufficiency - Guidelines suggest surveillance every 3-5 years. Given fatigue, patient-reported edema, and last echo 3 years ago, will repeat.   4. Generalized fatigue - could be a number of things. Will check basic bloodwork. The patient requests to be checked liver function as well as she has history of fatty liver. We discussed setting gradual health goals that would be attainable, rather  than feeling defeated at bigger goals that seem insurmountable. If fatigue persists despite increase in physical activity, would recommend she see primary care to examine other etiologies to include anxiety/stress or sleep apnea. 5. History of leg swelling - not present on exam today. By description, suspect venous insufficiency. We discussed compression/elevation and sodium restriction. If this recurs in the future, could consider adjunct diuretic therapy. 6. Screening for lipid disorders - will obtain CMET/lipid when she returns fasting. 7. Essential HTN - mildly elevated today in clinic. Metoprolol is not usually a great BP medication but does seem to be helping her palpitations. Instructions given for how to check blood pressure over the next week - she will contact us with results in 1 week. Consideration could be given to changing metoprolol to carvedilol or adding diuretic therapy.  Disposition: F/u with Dr. Meda Coffee in 1 year. She was also instructed to contact PCP about continued surveillance of thyroid nodules previously seen.  Medication Adjustments/Labs and Tests Ordered: Current medicines are reviewed at length with the patient today.  Concerns regarding medicines are outlined above. Medication changes, Labs and Tests ordered today are summarized above and listed in the Patient Instructions accessible in Encounters.   Signed, Charlie Pitter, PA-C  09/14/2019 12:19 PM    [facility] [address], [place], [place]  [address] Phone: [phone]; Fax: [phone]

## 2019-09-14 ENCOUNTER — Encounter: Payer: Self-pay | Admitting: Physician Assistant

## 2019-09-14 ENCOUNTER — Other Ambulatory Visit: Payer: Self-pay

## 2019-09-14 ENCOUNTER — Ambulatory Visit (INDEPENDENT_AMBULATORY_CARE_PROVIDER_SITE_OTHER): Payer: BC Managed Care – PPO | Admitting: Physician Assistant

## 2019-09-14 VITALS — BP 124/100 | HR 75 | Ht 63.0 in | Wt 196.4 lb

## 2019-09-14 DIAGNOSIS — Z87898 Personal history of other specified conditions: Secondary | ICD-10-CM | POA: Diagnosis not present

## 2019-09-14 DIAGNOSIS — I10 Essential (primary) hypertension: Secondary | ICD-10-CM

## 2019-09-14 DIAGNOSIS — R5383 Other fatigue: Secondary | ICD-10-CM

## 2019-09-14 DIAGNOSIS — I351 Nonrheumatic aortic (valve) insufficiency: Secondary | ICD-10-CM | POA: Diagnosis not present

## 2019-09-14 DIAGNOSIS — M7989 Other specified soft tissue disorders: Secondary | ICD-10-CM

## 2019-09-14 DIAGNOSIS — Z13220 Encounter for screening for lipoid disorders: Secondary | ICD-10-CM

## 2019-09-14 NOTE — Addendum Note (Signed)
Addended by: Charlie Pitter on: 09/14/2019 12:58 PM   Modules accepted: Orders

## 2019-09-14 NOTE — Patient Instructions (Addendum)
Medication Instructions:  Your physician recommends that you continue on your current medications as directed. Please refer to the Current Medication list given to you today.  *If you need a refill on your cardiac medications before your next appointment, please call your pharmacy*   Lab Work: AT THE TIME OF ECHOCARDIOGRAM:  (come fasting)  LIPID, CMET, CBC,  & TSH  If you have labs (blood work) drawn today and your tests are completely normal, you will receive your results only by:  [place] (if you have MyChart) OR  A paper copy in the mail If you have any lab test that is abnormal or we need to change your treatment, we will call you to review the results.   Testing/Procedures: Your physician has requested that you have an echocardiogram. Echocardiography is a painless test that uses sound waves to create images of your heart. It provides your doctor with information about the size and shape of your heart and how well your hearts chambers and valves are working. This procedure takes approximately one hour. There are no restrictions for this procedure.     Follow-Up: At [facility], you and your health needs are our priority.  As part of our continuing mission to provide you with exceptional heart care, we have created designated Provider Care Teams.  These Care Teams include your primary Cardiologist (physician) and Advanced Practice Providers (APPs -  Physician Assistants and Nurse Practitioners) who all work together to provide you with the care you need, when you need it.  We recommend signing up for the patient portal called "MyChart".  Sign up information is provided on this After Visit Summary.  MyChart is used to connect with patients for Virtual Visits (Telemedicine).  Patients are able to view lab/test results, encounter notes, upcoming appointments, etc.  Non-urgent messages can be sent to your provider as well.   To learn more about what you can do with MyChart, go  to [email or web address].    Your next appointment:   12 year(s)  The format for your next appointment:   In Person  Provider:   You may see Ena Dawley, MD or one of the following Advanced Practice Providers on your designated Care Team:    Melina Copa, PA-C  Ermalinda Barrios, PA-C    Other Instructions  I would recommend using a blood pressure cuff that goes on your arm. The wrist ones can be inaccurate. If possible, try to select one that also reports your heart rate. To check your blood pressure, choose a time at least 3 hours after taking your blood pressure medicines. If you can sample it at different times of the day, that's great - it might give you more information about how your blood pressure fluctuates. Remain seated in a chair for 5 minutes quietly beforehand, then check it. Please record a list of those readings and call us/send in MyChart message with them for our review in 1 week.    Echocardiogram An echocardiogram is a procedure that uses painless sound waves (ultrasound) to produce an image of the heart. Images from an echocardiogram can provide important information about:  Signs of coronary artery disease (CAD).  Aneurysm detection. An aneurysm is a weak or damaged part of an artery wall that bulges out from the normal force of blood pumping through the body.  Heart size and shape. Changes in the size or shape of the heart can be associated with certain conditions, including heart failure, aneurysm, and CAD.  Heart muscle  function.  Heart valve function.  Signs of a past heart attack.  Fluid buildup around the heart.  Thickening of the heart muscle.  A tumor or infectious growth around the heart valves. Tell a health care provider about:  Any allergies you have.  All medicines you are taking, including vitamins, herbs, eye drops, creams, and over-the-counter medicines.  Any blood disorders you have.  Any surgeries you have had.  Any medical  conditions you have.  Whether you are pregnant or may be pregnant. What are the risks? Generally, this is a safe procedure. However, problems may occur, including:  Allergic reaction to dye (contrast) that may be used during the procedure. What happens before the procedure? No specific preparation is needed. You may eat and drink normally. What happens during the procedure?   An IV tube may be inserted into one of your veins.  You may receive contrast through this tube. A contrast is an injection that improves the quality of the pictures from your heart.  A gel will be applied to your chest.  A wand-like tool (transducer) will be moved over your chest. The gel will help to transmit the sound waves from the transducer.  The sound waves will harmlessly bounce off of your heart to allow the heart images to be captured in real-time motion. The images will be recorded on a computer. The procedure may vary among health care providers and hospitals. What happens after the procedure?  You may return to your normal, everyday life, including diet, activities, and medicines, unless your health care provider tells you not to do that. Summary  An echocardiogram is a procedure that uses painless sound waves (ultrasound) to produce an image of the heart.  Images from an echocardiogram can provide important information about the size and shape of your heart, heart muscle function, heart valve function, and fluid buildup around your heart.  You do not need to do anything to prepare before this procedure. You may eat and drink normally.  After the echocardiogram is completed, you may return to your normal, everyday life, unless your health care provider tells you not to do that. This information is not intended to replace advice given to you by your health care provider. Make sure you discuss any questions you have with your health care provider. Document Revised: 06/16/2018 Document Reviewed:  03/28/2016 Elsevier Patient Education  [place].

## 2019-10-06 ENCOUNTER — Other Ambulatory Visit: Payer: BC Managed Care – PPO

## 2019-10-06 ENCOUNTER — Other Ambulatory Visit (HOSPITAL_COMMUNITY): Payer: BC Managed Care – PPO

## 2019-10-24 ENCOUNTER — Other Ambulatory Visit: Payer: BC Managed Care – PPO

## 2019-10-24 ENCOUNTER — Other Ambulatory Visit (HOSPITAL_COMMUNITY): Payer: BC Managed Care – PPO

## 2019-10-27 ENCOUNTER — Other Ambulatory Visit: Payer: BC Managed Care – PPO

## 2019-10-27 ENCOUNTER — Other Ambulatory Visit (HOSPITAL_COMMUNITY): Payer: BC Managed Care – PPO

## 2019-11-09 ENCOUNTER — Other Ambulatory Visit: Payer: BC Managed Care – PPO

## 2019-11-09 ENCOUNTER — Other Ambulatory Visit (HOSPITAL_COMMUNITY): Payer: BC Managed Care – PPO

## 2019-11-27 ENCOUNTER — Other Ambulatory Visit: Payer: BC Managed Care – PPO

## 2019-11-27 ENCOUNTER — Other Ambulatory Visit (HOSPITAL_COMMUNITY): Payer: BC Managed Care – PPO

## 2019-12-21 ENCOUNTER — Telehealth (HOSPITAL_COMMUNITY): Payer: Self-pay | Admitting: Physician Assistant

## 2019-12-21 NOTE — Telephone Encounter (Signed)
Just an FYI. We have made several attempts to contact this patient including sending a letter to schedule or reschedule their echocardiogram. We will be removing the patient from the echo WQ.   patient has cancelled echo on: 07/30,08/17/,08/20,09/02,09/20 and 12/22/2019. Pt cancelled 10/15 stating she has COVID /LBW pt will call back to reschedule. (per Lorriane Shire)  Thank you

## 2019-12-22 ENCOUNTER — Other Ambulatory Visit (HOSPITAL_COMMUNITY): Payer: BC Managed Care – PPO

## 2020-01-04 ENCOUNTER — Other Ambulatory Visit (HOSPITAL_COMMUNITY): Payer: BC Managed Care – PPO

## 2020-01-08 ENCOUNTER — Other Ambulatory Visit: Payer: Self-pay

## 2020-01-08 ENCOUNTER — Ambulatory Visit (HOSPITAL_COMMUNITY): Payer: BC Managed Care – PPO | Attending: Cardiology

## 2020-01-08 DIAGNOSIS — Z13220 Encounter for screening for lipoid disorders: Secondary | ICD-10-CM | POA: Insufficient documentation

## 2020-01-08 DIAGNOSIS — I351 Nonrheumatic aortic (valve) insufficiency: Secondary | ICD-10-CM | POA: Diagnosis present

## 2020-01-08 DIAGNOSIS — R5383 Other fatigue: Secondary | ICD-10-CM | POA: Diagnosis present

## 2020-01-08 DIAGNOSIS — Z87898 Personal history of other specified conditions: Secondary | ICD-10-CM | POA: Insufficient documentation

## 2020-01-08 DIAGNOSIS — M7989 Other specified soft tissue disorders: Secondary | ICD-10-CM | POA: Diagnosis present

## 2020-01-08 LAB — ECHOCARDIOGRAM COMPLETE
Area-P 1/2: 3.38 cm2
P 1/2 time: 573 msec
S' Lateral: 2.1 cm

## 2020-01-17 ENCOUNTER — Telehealth: Payer: Self-pay | Admitting: Cardiology

## 2020-01-17 NOTE — Telephone Encounter (Signed)
Returned call back to pt.  She has been made aware of her echocardiogram results and she verbalized understanding.   She said since her bp has been running pretty good now that she is over covid. Readings in the 119/70.  She was advised to let you know if she notices it continually running higher than 130/80.

## 2020-01-17 NOTE — Telephone Encounter (Signed)
Patient returning Jennifer's call for results - Anderson Malta will call her back. Patient verbalized understanding.

## 2020-01-17 NOTE — Telephone Encounter (Signed)
Noted. Happy to hear.

## 2022-05-21 NOTE — Progress Notes (Unsigned)
Cardiology Office Note   Date:  [id number]   ID:  Lindsey Conley, DOB [id number], MRN [id number]  PCP:  Allayne Butcher, NP    No chief complaint on file.  Palpitations  Wt Readings from Last 3 Encounters:  05/22/22 192 lb (87.1 kg)  09/14/19 196 lb 6.4 oz (89.1 kg)  10/28/18 199 lb (90.3 kg)       History of Present Illness: Lindsey Conley is a 62 y.o. 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    Prior records from 2/21 show: "She has followed in our clinic for history of palpitations and dyspnea with benign workup. Remote Holter reportedly normal. Echo in 2008 was unremarkable. OV in 2012 with Dr. Haroldine Laws raised question of symptoms r/t panic attacks.  ETT 10/2013 showed poor exercise tolerance but no evidence of ischemia (total exercise time 22min) - walking program recommended. Last echo 11/2016 showed EF 55-60%, grade 1 DD, mild AI. Carotid US 11/2017 showed no significant carotid disease, but thyroid nodules seen for which she subsequently has had workup/surveillance. Coronary CTA 11/2017 showed no CAD. She self-reports a history of anxiety and stress surrounding caring for others in her family."  Prior records: "put her on Toprol for elevated blood pressure. This was initially diagnosed earlier this year while going through home stress. She was trialed on amlodipine but felt constipation and hair loss so was changed to metoprolol at low dose. Overall she has done OK. The palpitations have improved. About twice in the last year she's had a fleeting sensation of dyspnea like she needs to take a deep breath. " She cares for her grandchildren but does not formally exercise. No angina reported with ADLs. She reports chronic fatigue which she feels is related to being overweight and not exercising. She does not snore. Her husband has to get up early so as a result she has a disrupted  sleep schedule as well. She also reports intermittent leg swelling after long car rides, being seated for a while, or trips to [place]."  Still has some palpitations  Past Medical History:  Diagnosis Date   Anemia    Anxiety    Dyspnea    a. ETT 2015: poor exercise tolerance but nonischemic. b. Normal cor CT 2019.   Gallstone    GERD (gastroesophageal reflux disease)    History of colonoscopy    HTN (hypertension)    Migraine    Mild AI (aortic insufficiency)    Palpitations    echo and Holter in 2009 were normal   Status post dilation of esophageal narrowing     Past Surgical History:  Procedure Laterality Date   DILATION AND CURETTAGE OF UTERUS     HYSTEROSCOPY       Current Outpatient Medications  Medication Sig Dispense Refill   Cholecalciferol (DIALYVITE VITAMIN D 5000 PO) Take 1-2 tablets by mouth daily.     ibuprofen (ADVIL) 200 MG tablet Take 200 mg by mouth every 6 (six) hours as needed.     Melatonin 1 MG SUBL Place 2 mg under the tongue at bedtime as needed.     metoprolol succinate (TOPROL-XL) 25 MG 24 hr tablet Take 25 mg by mouth daily.     ALPRAZolam (XANAX) 0.25 MG tablet Take 0.25 mg by mouth at bedtime as needed.   (Patient not taking: Reported on 05/22/2022)     aspirin EC 81 MG tablet Take  81 mg by mouth daily. Swallow whole. (Patient not taking: Reported on 05/22/2022)     No current facility-administered medications for this visit.    Allergies:   Phenergan [promethazine hcl], Promethazine hcl, and Penicillins    Social History:  The patient  reports that she has never smoked. She has never used smokeless tobacco. She reports current alcohol use. She reports that she does not use drugs.   Family History:  The patient's family history includes Asthma (age of onset: 55) in her mother; Hypertension (age of onset: 49) in her father; Other in her mother; Other (age of onset: 55) in her sister; Prostate cancer in her father.    ROS:  Please see the  history of present illness.   Otherwise, review of systems are positive for fatigue .   All other systems are reviewed and negative.    PHYSICAL EXAM: VS:  BP (!) 168/102   Pulse 72   Ht 5' 3.5" (1.613 m)   Wt 192 lb (87.1 kg)   LMP 12/21/2013   SpO2 95%   BMI 33.48 kg/m  , BMI Body mass index is 33.48 kg/m. GEN: Well nourished, well developed, in no acute distress HEENT: normal Neck: no JVD, carotid bruits, or masses Cardiac: RRR; no murmurs, rubs, or gallops,no edema  Respiratory:  clear to auscultation bilaterally, normal work of breathing GI: soft, nontender, nondistended, + BS MS: no deformity or atrophy Skin: warm and dry, no rash Neuro:  Strength and sensation are intact Psych: euthymic mood, full affect   EKG:   The ekg ordered today demonstrates normal ECG   Recent Labs: No results found for requested labs within last 365 days.   Lipid Panel    Component Value Date/Time   CHOL 172 12/27/2013 0958   TRIG 67.0 12/27/2013 0958   HDL 55.60 12/27/2013 0958   CHOLHDL 3 12/27/2013 0958   VLDL 13.4 12/27/2013 0958   LDLCALC 103 (H) 12/27/2013 0958     Other studies Reviewed: Additional studies/ records that were reviewed today with results demonstrating: 2019 CTA reviewed with calcium score of 0.   ASSESSMENT AND PLAN:  Palpitations: Brief episodes.  No syncope.  Some fatigue.  Check TSH along with other routine labs. Mild AI: No CHF sx. 2021 echo reviewed showing normal LV function and no change in AI. Leg swelling: Elevate legs.  Minimize salt in diet.  Can try compression stockings.  No need for diuretics at this time.  NASH/MASH: Whole food plant-based diet.  High-fiber diet. HTN: On metoprolol.  Would consider ACE-I vs.  Additional  lifestyle modifications with better diet and exercise. PreDM: Whole food, plant based diet.  Diagnosed in the past.  Calcium scoring CT. we spoke at length about lifestyle changes that would be beneficial for her fatty liver,  blood pressure and prediabetes   Current medicines are reviewed at length with the patient today.  The patient concerns regarding her medicines were addressed.  The following changes have been made:  No change  Labs/ tests ordered today include:  No orders of the defined types were placed in this encounter.   Recommend 150 minutes/week of aerobic exercise Low fat, low carb, high fiber diet recommended  Disposition:   FU in 1 year   Signed, Larae Grooms, MD  05/22/2022 9:35 AM    [facility] [place], [place], [place]  [address] Phone: [phone]; Fax: [phone]

## 2022-05-22 ENCOUNTER — Encounter: Payer: Self-pay | Admitting: Interventional Cardiology

## 2022-05-22 ENCOUNTER — Ambulatory Visit: Payer: BC Managed Care – PPO | Attending: Interventional Cardiology | Admitting: Interventional Cardiology

## 2022-05-22 VITALS — BP 168/102 | HR 72 | Ht 63.5 in | Wt 192.0 lb

## 2022-05-22 DIAGNOSIS — I10 Essential (primary) hypertension: Secondary | ICD-10-CM

## 2022-05-22 DIAGNOSIS — E785 Hyperlipidemia, unspecified: Secondary | ICD-10-CM

## 2022-05-22 DIAGNOSIS — R5383 Other fatigue: Secondary | ICD-10-CM | POA: Diagnosis not present

## 2022-05-22 NOTE — Patient Instructions (Signed)
Medication Instructions:  Your physician recommends that you continue on your current medications as directed. Please refer to the Current Medication list given to you today.  *If you need a refill on your cardiac medications before your next appointment, please call your pharmacy*   Lab Work: Lab work to be done today- CBC, CMET, Lipids, TSH If you have labs (blood work) drawn today and your tests are completely normal, you will receive your results only by: [place] (if you have MyChart) OR A paper copy in the mail If you have any lab test that is abnormal or we need to change your treatment, we will call you to review the results.   Testing/Procedures: Dr Irish Lack recommends you have a Calcium Score CT Scan   Follow-Up: At [facility], you and your health needs are our priority.  As part of our continuing mission to provide you with exceptional heart care, we have created designated Provider Care Teams.  These Care Teams include your primary Cardiologist (physician) and Advanced Practice Providers (APPs -  Physician Assistants and Nurse Practitioners) who all work together to provide you with the care you need, when you need it.  We recommend signing up for the patient portal called "MyChart".  Sign up information is provided on this After Visit Summary.  MyChart is used to connect with patients for Virtual Visits (Telemedicine).  Patients are able to view lab/test results, encounter notes, upcoming appointments, etc.  Non-urgent messages can be sent to your provider as well.   To learn more about what you can do with MyChart, go to [email or web address].    Your next appointment:   12 month(s)  Provider:   Dr Irish Lack    Other Instructions  High-Fiber Eating Plan Fiber, also called dietary fiber, is a type of carbohydrate. It is found foods such as fruits, vegetables, whole grains, and beans. A high-fiber diet can have many health benefits. Your health care  provider may recommend a high-fiber diet to help: Prevent constipation. Fiber can make your bowel movements more regular. Lower your cholesterol. Relieve the following conditions: Inflammation of veins in the anus (hemorrhoids). Inflammation of specific areas of the digestive tract (uncomplicated diverticulosis). A problem of the large intestine, also called the colon, that sometimes causes pain and diarrhea (irritable bowel syndrome, or IBS). Prevent overeating as part of a weight-loss plan. Prevent heart disease, type 2 diabetes, and certain cancers. What are tips for following this plan? Reading food labels  Check the nutrition facts label on food products for the amount of dietary fiber. Choose foods that have 5 grams of fiber or more per serving. The goals for recommended daily fiber intake include: Men (age 33 or younger): 34-38 g. Men (over age 50): 28-34 g. Women (age 19 or younger): 25-28 g. Women (over age 73): 22-25 g. Your daily fiber goal is _____________ g. Shopping Choose whole fruits and vegetables instead of processed forms, such as apple juice or applesauce. Choose a wide variety of high-fiber foods such as avocados, lentils, oats, and kidney beans. Read the nutrition facts label of the foods you choose. Be aware of foods with added fiber. These foods often have high sugar and sodium amounts per serving. Cooking Use whole-grain flour for baking and cooking. Cook with brown rice instead of white rice. Meal planning Start the day with a breakfast that is high in fiber, such as a cereal that contains 5 g of fiber or more per serving. Eat breads and cereals that are  made with whole-grain flour instead of refined flour or white flour. Eat brown rice, bulgur wheat, or millet instead of white rice. Use beans in place of meat in soups, salads, and pasta dishes. Be sure that half of the grains you eat each day are whole grains. General information You can get the recommended  daily intake of dietary fiber by: Eating a variety of fruits, vegetables, grains, nuts, and beans. Taking a fiber supplement if you are not able to take in enough fiber in your diet. It is better to get fiber through food than from a supplement. Gradually increase how much fiber you consume. If you increase your intake of dietary fiber too quickly, you may have bloating, cramping, or gas. Drink plenty of water to help you digest fiber. Choose high-fiber snacks, such as berries, raw vegetables, nuts, and popcorn. What foods should I eat? Fruits Berries. Pears. Apples. Oranges. Avocado. Prunes and raisins. Dried figs. Vegetables Sweet potatoes. Spinach. Kale. Artichokes. Cabbage. Broccoli. Cauliflower. Green peas. Carrots. Squash. Grains Whole-grain breads. Multigrain cereal. Oats and oatmeal. Brown rice. Barley. Bulgur wheat. [place]. Quinoa. Bran muffins. Popcorn. Rye wafer crackers. Meats and other proteins Navy beans, kidney beans, and pinto beans. Soybeans. Split peas. Lentils. Nuts and seeds. Dairy Fiber-fortified yogurt. Beverages Fiber-fortified soy milk. Fiber-fortified orange juice. Other foods Fiber bars. The items listed above may not be a complete list of recommended foods and beverages. Contact a dietitian for more information. What foods should I avoid? Fruits Fruit juice. Cooked, strained fruit. Vegetables Fried potatoes. Canned vegetables. Well-cooked vegetables. Grains White bread. Pasta made with refined flour. White rice. Meats and other proteins Fatty cuts of meat. Fried chicken or fried fish. Dairy Milk. Yogurt. Cream cheese. Sour cream. Fats and oils Butters. Beverages Soft drinks. Other foods Cakes and pastries. The items listed above may not be a complete list of foods and beverages to avoid. Talk with your dietitian about what choices are best for you. Summary Fiber is a type of carbohydrate. It is found in foods such as fruits, vegetables, whole  grains, and beans. A high-fiber diet has many benefits. It can help to prevent constipation, lower blood cholesterol, aid weight loss, and reduce your risk of heart disease, diabetes, and certain cancers. Increase your intake of fiber gradually. Increasing fiber too quickly may cause cramping, bloating, and gas. Drink plenty of water while you increase the amount of fiber you consume. The best sources of fiber include whole fruits and vegetables, whole grains, nuts, seeds, and beans. This information is not intended to replace advice given to you by your health care provider. Make sure you discuss any questions you have with your health care provider. Document Revised: 06/29/2019 Document Reviewed: 06/29/2019 Elsevier Patient Education  [place].

## 2022-05-23 LAB — COMPREHENSIVE METABOLIC PANEL
ALT: 25 IU/L (ref 0–32)
AST: 19 IU/L (ref 0–40)
Albumin/Globulin Ratio: 1.4 (ref 1.2–2.2)
Albumin: 4.1 g/dL (ref 3.8–4.9)
Alkaline Phosphatase: 57 IU/L (ref 44–121)
BUN/Creatinine Ratio: 15 (ref 9–23)
BUN: 11 mg/dL (ref 6–24)
Bilirubin Total: 0.6 mg/dL (ref 0.0–1.2)
CO2: 24 mmol/L (ref 20–29)
Calcium: 9.3 mg/dL (ref 8.7–10.2)
Chloride: 103 mmol/L (ref 96–106)
Creatinine, Ser: 0.74 mg/dL (ref 0.57–1.00)
Globulin, Total: 3 g/dL (ref 1.5–4.5)
Glucose: 109 mg/dL — ABNORMAL HIGH (ref 70–99)
Potassium: 4.6 mmol/L (ref 3.5–5.2)
Sodium: 142 mmol/L (ref 134–144)
Total Protein: 7.1 g/dL (ref 6.0–8.5)
eGFR: 93 mL/min/{1.73_m2} (ref >59)

## 2022-05-23 LAB — LIPID PANEL
Chol/HDL Ratio: 4.6 ratio — ABNORMAL HIGH (ref 0.0–4.4)
Cholesterol, Total: 212 mg/dL — ABNORMAL HIGH (ref 100–199)
HDL: 46 mg/dL (ref >39)
LDL Chol Calc (NIH): 146 mg/dL — ABNORMAL HIGH (ref 0–99)
Triglycerides: 111 mg/dL (ref 0–149)
VLDL Cholesterol Cal: 20 mg/dL (ref 5–40)

## 2022-05-23 LAB — TSH: TSH: 0.741 u[IU]/mL (ref 0.450–4.500)

## 2022-05-23 LAB — CBC
Hematocrit: 41.6 % (ref 34.0–46.6)
Hemoglobin: 14 g/dL (ref 11.1–15.9)
MCH: 28.7 pg (ref 26.6–33.0)
MCHC: 33.7 g/dL (ref 31.5–35.7)
MCV: 85 fL (ref 79–97)
Platelets: 261 10*3/uL (ref 150–450)
RBC: 4.88 x10E6/uL (ref 3.77–5.28)
RDW: 13.2 % (ref 11.7–15.4)
WBC: 6.8 10*3/uL (ref 3.4–10.8)

## 2022-07-02 ENCOUNTER — Ambulatory Visit (HOSPITAL_BASED_OUTPATIENT_CLINIC_OR_DEPARTMENT_OTHER)
Admission: RE | Admit: 2022-07-02 | Discharge: 2022-07-02 | Disposition: A | Discharge status: Home / Self Care | Payer: BC Managed Care – PPO | Source: Ambulatory Visit | Attending: Interventional Cardiology | Admitting: Interventional Cardiology

## 2022-07-02 DIAGNOSIS — I10 Essential (primary) hypertension: Secondary | ICD-10-CM | POA: Insufficient documentation

## 2022-07-02 DIAGNOSIS — E785 Hyperlipidemia, unspecified: Secondary | ICD-10-CM | POA: Insufficient documentation

## 2022-07-02 DIAGNOSIS — R5383 Other fatigue: Secondary | ICD-10-CM | POA: Insufficient documentation
